# Patient Record
Sex: Male | Born: 1980 | Race: White | Hispanic: No | Marital: Single | Smoking: Current every day smoker
Health system: Southern US, Community
[De-identification: ages and names within clinical notes are randomized; demographics above are authoritative.]

## PROBLEM LIST (undated history)

## (undated) DIAGNOSIS — N289 Disorder of kidney and ureter, unspecified: Secondary | ICD-10-CM

## (undated) DIAGNOSIS — J9819 Other pulmonary collapse: Secondary | ICD-10-CM

## (undated) HISTORY — PX: APPENDECTOMY: SHX54

---

## 2014-04-22 ENCOUNTER — Encounter (HOSPITAL_COMMUNITY): Payer: Self-pay | Admitting: *Deleted

## 2014-04-22 ENCOUNTER — Inpatient Hospital Stay (HOSPITAL_COMMUNITY)
Admission: EM | Admit: 2014-04-22 | Discharge: 2014-04-26 | DRG: 201 | Disposition: A | Discharge status: Home / Self Care | Payer: Medicaid Other | Attending: General Surgery | Admitting: General Surgery

## 2014-04-22 DIAGNOSIS — Z9689 Presence of other specified functional implants: Secondary | ICD-10-CM

## 2014-04-22 DIAGNOSIS — J939 Pneumothorax, unspecified: Secondary | ICD-10-CM

## 2014-04-22 DIAGNOSIS — J40 Bronchitis, not specified as acute or chronic: Secondary | ICD-10-CM | POA: Diagnosis present

## 2014-04-22 DIAGNOSIS — Z720 Tobacco use: Secondary | ICD-10-CM

## 2014-04-22 DIAGNOSIS — R0789 Other chest pain: Secondary | ICD-10-CM | POA: Diagnosis present

## 2014-04-22 DIAGNOSIS — J069 Acute upper respiratory infection, unspecified: Secondary | ICD-10-CM | POA: Diagnosis present

## 2014-04-22 DIAGNOSIS — Z4682 Encounter for fitting and adjustment of non-vascular catheter: Secondary | ICD-10-CM

## 2014-04-22 DIAGNOSIS — F17210 Nicotine dependence, cigarettes, uncomplicated: Secondary | ICD-10-CM | POA: Diagnosis present

## 2014-04-22 DIAGNOSIS — Z809 Family history of malignant neoplasm, unspecified: Secondary | ICD-10-CM | POA: Diagnosis not present

## 2014-04-22 DIAGNOSIS — J9383 Other pneumothorax: Secondary | ICD-10-CM | POA: Diagnosis present

## 2014-04-22 LAB — CBC WITH DIFFERENTIAL/PLATELET
BASOS PCT: 0 % (ref 0–1)
Basophils Absolute: 0 10*3/uL (ref 0.0–0.1)
EOS PCT: 4 % (ref 0–5)
Eosinophils Absolute: 0.4 10*3/uL (ref 0.0–0.7)
HCT: 40.3 % (ref 39.0–52.0)
Hemoglobin: 13.9 g/dL (ref 13.0–17.0)
Lymphocytes Relative: 41 % (ref 12–46)
Lymphs Abs: 4.9 10*3/uL — ABNORMAL HIGH (ref 0.7–4.0)
MCH: 32.9 pg (ref 26.0–34.0)
MCHC: 34.5 g/dL (ref 30.0–36.0)
MCV: 95.5 fL (ref 78.0–100.0)
MONO ABS: 0.9 10*3/uL (ref 0.1–1.0)
Monocytes Relative: 8 % (ref 3–12)
NEUTROS PCT: 47 % (ref 43–77)
Neutro Abs: 5.6 10*3/uL (ref 1.7–7.7)
PLATELETS: 272 10*3/uL (ref 150–400)
RBC: 4.22 MIL/uL (ref 4.22–5.81)
RDW: 13.4 % (ref 11.5–15.5)
WBC: 11.9 10*3/uL — ABNORMAL HIGH (ref 4.0–10.5)

## 2014-04-22 NOTE — ED Provider Notes (Signed)
CSN: [id number]     Arrival date & time 04/22/14  2235 History  This chart was scribed for Andre Boozeavid Jelisa Creswell, MD by Haywood PaoNadim Abu Hashem, ED Scribe. The patient was seen in [facility] and the patient's care was started at 11:02 PM.  Chief Complaint  Patient presents with  . Shortness of Breath   Patient is a 34 y.o. male presenting with shortness of breath. The history is provided by the patient. No language interpreter was used.  Shortness of Breath Associated symptoms: chest pain, cough and diaphoresis   Associated symptoms: no fever     HPI Comments: [name] is a 34 y.o. male who presents to the Emergency Department complaining of SOB ongoing intermittently for years but the SOB has worsened 3 days ago. Pt states he goes to sleep and wakes up gasping for air. He was seen today at [facility] and had a chest x-ray and advised to come to ER. He also complains of right sided chest pain that hurts with movements "rated 20/10". Pt states he does not have pain when sitting. He has a cough producing a yellow/black phlegm, chills and sweats at night as associated symptoms. He denies fever. Pt is a currently an every day smoker.  History reviewed. No pertinent past medical history. Past Surgical History  Procedure Laterality Date  . Appendectomy     History reviewed. No pertinent family history. History  Substance Use Topics  . Smoking status: Current Every Day Smoker  . Smokeless tobacco: Not on file  . Alcohol Use: Yes    Review of Systems  Constitutional: Positive for chills and diaphoresis. Negative for fever.  Respiratory: Positive for cough and shortness of breath.   Cardiovascular: Positive for chest pain.  All other systems reviewed and are negative.   Allergies  Review of patient's allergies indicates no known allergies.  Home Medications   Prior to Admission medications   Not on File   BP 136/91 mmHg  Pulse 88  Temp(Src) 98.1 F (36.7 C) (Oral)  Resp 20  Ht 5'  10" (1.778 m)  Wt 147 lb (66.679 kg)  BMI 21.09 kg/m2  SpO2 98% Physical Exam  Constitutional: He is oriented to person, place, and time. He appears well-developed and well-nourished.  HENT:  Head: Normocephalic and atraumatic.  Eyes: EOM are normal. Pupils are equal, round, and reactive to light.  Neck: Normal range of motion. Neck supple. No JVD present. No tracheal deviation present.  Cardiovascular: Normal rate, regular rhythm and normal heart sounds.   No murmur heard. Pulmonary/Chest: Effort normal. No respiratory distress. He has no wheezes. He has no rales.  Decreased breath sounds on the right and scattered rhonchi on the right.  Abdominal: Soft. Bowel sounds are normal. He exhibits no distension and no mass. There is no tenderness.  Musculoskeletal: Normal range of motion. He exhibits no edema.  Lymphadenopathy:    He has no cervical adenopathy.  Neurological: He is alert and oriented to person, place, and time. No cranial nerve deficit. Coordination normal.  Skin: Skin is warm and dry. No rash noted.  Psychiatric: He has a normal mood and affect. His behavior is normal. Judgment and thought content normal.  Nursing note and vitals reviewed.   ED Course  Procedures  DIAGNOSTIC STUDIES: Oxygen Saturation is 98% on room air, normal by my interpretation.    COORDINATION OF CARE: 11:10 PM Discussed treatment plan with pt at bedside and pt agreed to plan.  Labs Review Results for orders placed  or performed during the hospital encounter of 04/22/14  Basic metabolic panel  Result Value Ref Range   Sodium 134 (L) 135 - 145 mmol/L   Potassium 3.4 (L) 3.5 - 5.1 mmol/L   Chloride 111 96 - 112 mmol/L   CO2 23 19 - 32 mmol/L   Glucose, Bld 105 (H) 70 - 99 mg/dL   BUN 21 6 - 23 mg/dL   Creatinine, Ser 1.61 0.50 - 1.35 mg/dL   Calcium 9.0 8.4 - 09.6 mg/dL   GFR calc non Af Amer >90 >90 mL/min   GFR calc Af Amer >90 >90 mL/min   Anion gap 0 (L) 5 - 15  CBC with Differential   Result Value Ref Range   WBC 11.9 (H) 4.0 - 10.5 K/uL   RBC 4.22 4.22 - 5.81 MIL/uL   Hemoglobin 13.9 13.0 - 17.0 g/dL   HCT 04.5 40.9 - 81.1 %   MCV 95.5 78.0 - 100.0 fL   MCH 32.9 26.0 - 34.0 pg   MCHC 34.5 30.0 - 36.0 g/dL   RDW 91.4 78.2 - 95.6 %   Platelets 272 150 - 400 K/uL   Neutrophils Relative % 47 43 - 77 %   Neutro Abs 5.6 1.7 - 7.7 K/uL   Lymphocytes Relative 41 12 - 46 %   Lymphs Abs 4.9 (H) 0.7 - 4.0 K/uL   Monocytes Relative 8 3 - 12 %   Monocytes Absolute 0.9 0.1 - 1.0 K/uL   Eosinophils Relative 4 0 - 5 %   Eosinophils Absolute 0.4 0.0 - 0.7 K/uL   Basophils Relative 0 0 - 1 %   Basophils Absolute 0.0 0.0 - 0.1 K/uL     MDM   Final diagnoses:  Spontaneous pneumothorax    Exacerbation of chronic dyspnea of. He had an x-ray done as an outpatient. I was able to retrieve that x-ray and it shows a right-sided pneumothorax of approximately 25% without any evidence of tension pneumothorax. Symptoms of been present for 3 days, so I feel he would be at relatively low risk for progression. He is placed on oxygen via nonrebreather mask. Case is discussed with Dr. Lovell Sheehan of general surgery service who states that he will be willing to follow the patient as a Research scientist (medical). Case is discussed with Dr. Selena Batten of [facility] who agrees to admit the patient.   I personally performed the services described in this documentation, which was scribed in my presence. The recorded information has been reviewed and is accurate.       Andre Booze, MD 04/23/14 [phone]

## 2014-04-22 NOTE — ED Notes (Addendum)
Sob for "years".  Worse since Friday. Cough green sputum.  Went to [facility] today and advised to come to ER  Says he had abn CXR  And needed ct of chest

## 2014-04-22 NOTE — H&P (Signed)
[name] is an 34 y.o. male.    Pcp: unassigned  Chief Complaint: cough HPI: 34 yo male with cough x 5 days,  Slight yellow sputum.  + sob, + cp with cough.   Denies fever chills, palp, n/v, diarrhea, brbpr, black stool.  Pt went to [facility] and was found to have abnormal CXR and sent to ER.  In ED pt noted to have ? Pneumothorax.  Surgery consulted by ED and requested medical admission and observation for now.  We appreciate their input.   History reviewed. No pertinent past medical history.  Past Surgical History  Procedure Laterality Date  . Appendectomy      Family History  Problem Relation Age of Onset  . Cancer Maternal Aunt    Social History:  reports that he has been smoking Cigarettes.  He has a 14 pack-year smoking history. He does not have any smokeless tobacco history on file. He reports that he drinks about 1.2 oz of alcohol per week. He reports that he does not use illicit drugs.  Allergies: No Known Allergies Medications none  (Not in a hospital admission)  No results found for this or any previous visit (from the past 48 hour(s)). No results found.  Review of Systems  Constitutional: Negative for fever, chills, weight loss, malaise/fatigue and diaphoresis.  HENT: Negative.   Eyes: Negative.   Respiratory: Positive for cough, sputum production and shortness of breath. Negative for hemoptysis and wheezing.   Cardiovascular: Positive for chest pain. Negative for palpitations, orthopnea, claudication, leg swelling and PND.  Gastrointestinal: Negative for heartburn, nausea, vomiting, abdominal pain, diarrhea, constipation, blood in stool and melena.  Genitourinary: Negative for dysuria, urgency, frequency, hematuria and flank pain.  Musculoskeletal: Negative for myalgias, back pain, joint pain, falls and neck pain.  Skin: Negative for itching and rash.  Neurological: Negative for dizziness, tingling, tremors, sensory change, speech  change, focal weakness, seizures, loss of consciousness and weakness.  Endo/Heme/Allergies: Negative for environmental allergies and polydipsia. Does not bruise/bleed easily.  Psychiatric/Behavioral: Negative for depression, suicidal ideas, hallucinations, memory loss and substance abuse. The patient is not nervous/anxious and does not have insomnia.     Blood pressure 136/91, pulse 88, temperature 98.1 F (36.7 C), temperature source Oral, resp. rate 20, height  (1.778 m), weight 66.679 kg (147 lb), SpO2 98 %. Physical Exam  Constitutional: He is oriented to person, place, and time. He appears well-developed and well-nourished.  HENT:  Head: Normocephalic and atraumatic.  Eyes: Conjunctivae and EOM are normal. Pupils are equal, round, and reactive to light. No scleral icterus.  Neck: Normal range of motion. Neck supple. No JVD present. No tracheal deviation present. No thyromegaly present.  Cardiovascular: Normal rate and regular rhythm.  Exam reveals no gallop and no friction rub.   No murmur heard. Respiratory: Effort normal and breath sounds normal. No respiratory distress. He has no wheezes. He has no rales.  GI: Soft. Bowel sounds are normal. He exhibits no distension. There is no tenderness. There is no rebound and no guarding.  Musculoskeletal: Normal range of motion. He exhibits no edema or tenderness.  Lymphadenopathy:    He has no cervical adenopathy.  Neurological: He is alert and oriented to person, place, and time. He has normal reflexes. He displays normal reflexes. No cranial nerve deficit. He exhibits normal muscle tone. Coordination normal.  Skin: Skin is warm and dry. No rash noted. No erythema. No pallor.  Psychiatric: He has a normal mood and  affect. His behavior is normal. Judgment and thought content normal.     Assessment/Plan Cough Bronchitis zithromax 500mg  iv qday tussionex  Pneumonthorax Admit to stepdown o2 Observe CXR tomorrow    Pearson GrippeKIM,  Caroleen Stoermer 04/22/2014, 11:40 PM

## 2014-04-23 ENCOUNTER — Inpatient Hospital Stay (HOSPITAL_COMMUNITY): Payer: Medicaid Other

## 2014-04-23 DIAGNOSIS — Z720 Tobacco use: Secondary | ICD-10-CM

## 2014-04-23 DIAGNOSIS — J9383 Other pneumothorax: Principal | ICD-10-CM

## 2014-04-23 LAB — CBC
HEMATOCRIT: 41 % (ref 39.0–52.0)
HEMOGLOBIN: 14 g/dL (ref 13.0–17.0)
MCH: 32.9 pg (ref 26.0–34.0)
MCHC: 34.1 g/dL (ref 30.0–36.0)
MCV: 96.5 fL (ref 78.0–100.0)
Platelets: 278 10*3/uL (ref 150–400)
RBC: 4.25 MIL/uL (ref 4.22–5.81)
RDW: 13.4 % (ref 11.5–15.5)
WBC: 11.8 10*3/uL — ABNORMAL HIGH (ref 4.0–10.5)

## 2014-04-23 LAB — BASIC METABOLIC PANEL
Anion gap: 0 — ABNORMAL LOW (ref 5–15)
BUN: 21 mg/dL (ref 6–23)
CALCIUM: 9 mg/dL (ref 8.4–10.5)
CO2: 23 mmol/L (ref 19–32)
Chloride: 111 mmol/L (ref 96–112)
Creatinine, Ser: 0.82 mg/dL (ref 0.50–1.35)
GFR calc Af Amer: >90 mL/min (ref >90)
GFR calc non Af Amer: >90 mL/min (ref >90)
GLUCOSE: 105 mg/dL — AB (ref 70–99)
Potassium: 3.4 mmol/L — ABNORMAL LOW (ref 3.5–5.1)
Sodium: 134 mmol/L — ABNORMAL LOW (ref 135–145)

## 2014-04-23 LAB — COMPREHENSIVE METABOLIC PANEL
ALBUMIN: 3.8 g/dL (ref 3.5–5.2)
ALT: 22 U/L (ref 0–53)
AST: 24 U/L (ref 0–37)
Alkaline Phosphatase: 66 U/L (ref 39–117)
Anion gap: 6 (ref 5–15)
BUN: 19 mg/dL (ref 6–23)
CALCIUM: 9 mg/dL (ref 8.4–10.5)
CO2: 23 mmol/L (ref 19–32)
Chloride: 109 mmol/L (ref 96–112)
Creatinine, Ser: 0.79 mg/dL (ref 0.50–1.35)
GFR calc Af Amer: >90 mL/min (ref >90)
GFR calc non Af Amer: >90 mL/min (ref >90)
Glucose, Bld: 91 mg/dL (ref 70–99)
Potassium: 3.8 mmol/L (ref 3.5–5.1)
SODIUM: 138 mmol/L (ref 135–145)
TOTAL PROTEIN: 7.1 g/dL (ref 6.0–8.3)
Total Bilirubin: 0.3 mg/dL (ref 0.3–1.2)

## 2014-04-23 LAB — MRSA PCR SCREENING: MRSA by PCR: POSITIVE — AB

## 2014-04-23 MED ORDER — DEXTROSE 5 % IV SOLN
INTRAVENOUS | Status: AC
  Filled 2014-04-23: qty 500

## 2014-04-23 MED ORDER — MIDAZOLAM HCL 2 MG/2ML IJ SOLN
2.0000 mg | INTRAMUSCULAR | Status: AC
  Administered 2014-04-23: 2 mg via INTRAVENOUS

## 2014-04-23 MED ORDER — ACETAMINOPHEN 325 MG PO TABS: 650.0000 mg | ORAL_TABLET | Freq: Four times a day (QID) | ORAL | Status: DC | PRN

## 2014-04-23 MED ORDER — IBUPROFEN 800 MG PO TABS
400.0000 mg | ORAL_TABLET | Freq: Three times a day (TID) | ORAL | Status: DC | PRN
  Administered 2014-04-23 – 2014-04-24 (×3): 400 mg via ORAL
  Filled 2014-04-23 (×3): qty 1

## 2014-04-23 MED ORDER — INFLUENZA VAC SPLIT QUAD 0.5 ML IM SUSY
0.5000 mL | PREFILLED_SYRINGE | INTRAMUSCULAR | Status: AC
  Administered 2014-04-24: 0.5 mL via INTRAMUSCULAR
  Filled 2014-04-23: qty 0.5

## 2014-04-23 MED ORDER — ACETAMINOPHEN 650 MG RE SUPP: 650.0000 mg | Freq: Four times a day (QID) | RECTAL | Status: DC | PRN

## 2014-04-23 MED ORDER — LORAZEPAM 2 MG/ML IJ SOLN
1.0000 mg | INTRAMUSCULAR | Status: DC | PRN
  Administered 2014-04-23 – 2014-04-24 (×2): 1 mg via INTRAVENOUS
  Filled 2014-04-23 (×2): qty 1

## 2014-04-23 MED ORDER — MUPIROCIN 2 % EX OINT
1.0000 "application " | TOPICAL_OINTMENT | Freq: Two times a day (BID) | CUTANEOUS | Status: DC
  Administered 2014-04-23 – 2014-04-26 (×7): 1 via NASAL
  Filled 2014-04-23 (×2): qty 22

## 2014-04-23 MED ORDER — PNEUMOCOCCAL VAC POLYVALENT 25 MCG/0.5ML IJ INJ
0.5000 mL | INJECTION | INTRAMUSCULAR | Status: AC
  Administered 2014-04-24: 0.5 mL via INTRAMUSCULAR
  Filled 2014-04-23 (×2): qty 0.5

## 2014-04-23 MED ORDER — MORPHINE SULFATE 2 MG/ML IJ SOLN
INTRAMUSCULAR | Status: AC
  Administered 2014-04-23: 2 mg via INTRAVENOUS
  Filled 2014-04-23: qty 1

## 2014-04-23 MED ORDER — DEXTROSE 5 % IV SOLN
500.0000 mg | Freq: Every day | INTRAVENOUS | Status: DC
  Administered 2014-04-23 – 2014-04-24 (×3): 500 mg via INTRAVENOUS
  Filled 2014-04-23 (×4): qty 500

## 2014-04-23 MED ORDER — MORPHINE SULFATE 2 MG/ML IJ SOLN
2.0000 mg | INTRAMUSCULAR | Status: AC
  Administered 2014-04-23: 2 mg via INTRAVENOUS

## 2014-04-23 MED ORDER — DEXTROSE 5 % IV SOLN: 500.0000 mg | INTRAVENOUS | Status: DC

## 2014-04-23 MED ORDER — HYDROCOD POLST-CHLORPHEN POLST 10-8 MG/5ML PO LQCR: 5.0000 mL | Freq: Two times a day (BID) | ORAL | Status: DC | PRN

## 2014-04-23 MED ORDER — CHLORHEXIDINE GLUCONATE CLOTH 2 % EX PADS
6.0000 | MEDICATED_PAD | Freq: Every day | CUTANEOUS | Status: DC
Start: 2014-04-23 — End: 2014-04-26
  Administered 2014-04-24 – 2014-04-26 (×3): 6 via TOPICAL

## 2014-04-23 MED ORDER — SODIUM CHLORIDE 0.9 % IJ SOLN
3.0000 mL | Freq: Two times a day (BID) | INTRAMUSCULAR | Status: DC
  Administered 2014-04-23 – 2014-04-26 (×7): 3 mL via INTRAVENOUS

## 2014-04-23 MED ORDER — ENOXAPARIN SODIUM 40 MG/0.4ML ~~LOC~~ SOLN
40.0000 mg | SUBCUTANEOUS | Status: DC
  Administered 2014-04-23 – 2014-04-26 (×4): 40 mg via SUBCUTANEOUS
  Filled 2014-04-23 (×4): qty 0.4

## 2014-04-23 MED ORDER — MIDAZOLAM HCL 2 MG/2ML IJ SOLN
INTRAMUSCULAR | Status: AC
  Administered 2014-04-23: 2 mg via INTRAVENOUS
  Filled 2014-04-23: qty 2

## 2014-04-23 NOTE — Progress Notes (Signed)
[facility] PROGRESS NOTE  Andre CaveJoseph Dorsey ZOX:[id number] DOB: 1980-11-22 DOA: 04/22/2014 PCP: No primary care provider on file.  Assessment/Plan: Spontaneous Right Pneumothorax -S/p chest tube placement this am. -Appreciate surgical assistance.  Tobacco Abuse -Counseled on cessation.  Code Status: Full Code Family Communication: Patient only  Disposition Plan: Home when ready   Consultants:  Surgery, Dr. Lovell SheehanJenkins   Antibiotics:  None   Subjective: No complaints. Wants to quit smoking.  Objective: Filed Vitals:   04/23/14 1000 04/23/14 1100 04/23/14 1148 04/23/14 1217  BP: 116/71 115/64  112/75  Pulse: 79 87  88  Temp:   98.5 F (36.9 C) 98.2 F (36.8 C)  TempSrc:   Oral Oral  Resp: 18 17  18   Height:      Weight:      SpO2: 96% 98%  99%    Intake/Output Summary (Last 24 hours) at 04/23/14 1823 Last data filed at 04/23/14 1800  Gross per 24 hour  Intake    730 ml  Output      0 ml  Net    730 ml   Filed Weights   04/22/14 2241 04/23/14 0113 04/23/14 0500  Weight: 66.679 kg (147 lb) 65.4 kg (144 lb 2.9 oz) 65.4 kg (144 lb 2.9 oz)    Exam:   General:  AA ox3, NAD  Cardiovascular: RRR, no M/R/G  Respiratory: CTA B  Abdomen: S/NT/ND/+BS  Extremities: no C/C/E   Neurologic:  Non-focal  Data Reviewed: Basic Metabolic Panel:  Recent Labs Lab 04/22/14 2325 04/23/14 0420  NA 134* 138  K 3.4* 3.8  CL 111 109  CO2 23 23  GLUCOSE 105* 91  BUN 21 19  CREATININE 0.82 0.79  CALCIUM 9.0 9.0   Liver Function Tests:  Recent Labs Lab 04/23/14 0420  AST 24  ALT 22  ALKPHOS 66  BILITOT 0.3  PROT 7.1  ALBUMIN 3.8   No results for input(s): LIPASE, AMYLASE in the last 168 hours. No results for input(s): AMMONIA in the last 168 hours. CBC:  Recent Labs Lab 04/22/14 2325 04/23/14 0420  WBC 11.9* 11.8*  NEUTROABS 5.6  --   HGB 13.9 14.0  HCT 40.3 41.0  MCV 95.5 96.5  PLT 272 278   Cardiac Enzymes: No results for  input(s): CKTOTAL, CKMB, CKMBINDEX, TROPONINI in the last 168 hours. BNP (last 3 results) No results for input(s): BNP in the last 8760 hours.  ProBNP (last 3 results) No results for input(s): PROBNP in the last 8760 hours.  CBG: No results for input(s): GLUCAP in the last 168 hours.  Recent Results (from the past 240 hour(s))  MRSA PCR Screening     Status: Abnormal   Collection Time: 04/23/14  1:06 AM  Result Value Ref Range Status   MRSA by PCR POSITIVE (A) NEGATIVE Final    Comment:        The GeneXpert MRSA Assay (FDA approved for NASAL specimens only), is one component of a comprehensive MRSA colonization surveillance program. It is not intended to diagnose MRSA infection nor to guide or monitor treatment for MRSA infections. RESULT CALLED TO, READ BACK BY AND VERIFIED WITH: Jaymes GraffLEANNE SCHONEWITZ ON [id number] AT 0810 BY RESSEGGER R      Studies: Dg Chest 2 View  04/23/2014   CLINICAL DATA:  Shortness of breath, pneumothorax  EXAM: CHEST  2 VIEW  COMPARISON:  04/22/2014  FINDINGS: Cardiac shadow is within normal limits. The left lung is well aerated without focal abnormality. The  right lung again demonstrates evidence of pneumothorax. The overall appearance is stable from the prior exam. Some atelectatic changes are noted in the right base stable from the prior study. No significant mediastinal shift is noted.  IMPRESSION: Right pneumothorax stable in appearance from the prior exam.   Electronically Signed   By: Alcide Clever M.D.   On: 04/23/2014 09:06   Dg Chest Port 1 View  04/23/2014   CLINICAL DATA:  Pneumothorax, chest tube placement  EXAM: PORTABLE CHEST - 1 VIEW  COMPARISON:  Portable exam 0943 hours compared to 0246 hours.  FINDINGS: New small bore lateral RIGHT thoracostomy tube identified.  Resolution of previously seen RIGHT pneumothorax.  Persistent RIGHT basilar atelectasis.  Normal heart size, mediastinal contours and pulmonary vascularity.  Remaining lungs clear.   IMPRESSION: Resolution of previously identified RIGHT pneumothorax post thoracostomy tube placement.  Persistent RIGHT basilar atelectasis.   Electronically Signed   By: Ulyses Southward M.D.   On: 04/23/2014 09:55    Scheduled Meds: . azithromycin  500 mg Intravenous QHS  . Chlorhexidine Gluconate Cloth  6 each Topical Q0600  . enoxaparin (LOVENOX) injection  40 mg Subcutaneous Q24H  . [START ON 04/24/2014] Influenza vac split quadrivalent PF  0.5 mL Intramuscular Tomorrow-1000  . mupirocin ointment  1 application Nasal BID  . [START ON 04/24/2014] pneumococcal 23 valent vaccine  0.5 mL Intramuscular Tomorrow-1000  . sodium chloride  3 mL Intravenous Q12H   Continuous Infusions:   Principal Problem:   Spontaneous pneumothorax Active Problems:   Pneumothorax    Time spent: 25 minutes. Greater than 50% of this time was spent in direct contact with the patient coordinating care.    Chaya Jan  [facility] Pager [phone]  If 7PM-7AM, please contact night-coverage at [email or web address], password [facility] 04/23/2014, 6:23 PM  LOS: 1 day

## 2014-04-23 NOTE — Progress Notes (Signed)
CRITICAL VALUE ALERT  Critical value received:  MRSA positive Date of notification:  04/23/2014 Time of notification:  9:55 AM Critical value read back:Yes.    Nurse who received alert:  Candelaria StagersLeigh anne schonewitz, rn   MD notified (1st page):  Ardyth HarpsHernandez  Time of first page:  Will notify on rounds, standing orders in place  MD notified (2nd page):  Time of second page  Responding MD:  Ardyth HarpsHernandez  Time MD responded:  Will notify on rounds

## 2014-04-23 NOTE — Progress Notes (Signed)
When transferred patient to room 301, the [place] drain fluid was no longer tidaling in the chamber. MD notified and stated it was "fine".  Passed on MD message in report. Schonewitz, Candelaria StagersLeigh Anne 04/23/2014

## 2014-04-23 NOTE — Progress Notes (Signed)
[facility], LPN on dept [id number]. Verbalized understanding.  Pt transferred to room 301 in safe and stable condition. Schonewitz, Candelaria StagersLeigh Anne 04/23/2014

## 2014-04-23 NOTE — Procedures (Signed)
Patient:  Andre CaveJoseph Dorsey  DOB:  December 15, 1980  MRN:  [id number]   Preop Diagnosis:  Spontaneous right pneumothorax  Postop Diagnosis:  Same  Procedure:  Right chest tube placement  Surgeon:  Franky MachoMark Pinchos Topel, M.D.  Anes:  Morphine 2 mg IV, Versed 2 mg IV  Indications:  Patient is a 34 year old white male heavy smoker who presented from an outside facility with a 10% right apical pneumothorax. He was admitted to the intensive care unit for further evaluation treatment. Follow-up chest x-ray reveals worsening collapse of the right lung. The patient needs a right chest tube placed. The risks and benefits of the procedure including bleeding and infection were fully explained to the patient, who gave informed consent.  Procedure note:  The patient was placed left lateral decubitus position after Demerol and morphine were given. Surgical site confirmation was performed. 1% Xylocaine was placed along the anterior lateral axillary line on the right. This was at approximately the fifth intercostal space. A small bore chest tube was then placed without difficulty. This was hoped to the Pleur-evac and air was aspirated from the pleural space. The patient's oxygen saturations at the end of the procedure had increased from 90 to 96%. The catheter was secured in place using a 3-0 silk suture. A dressing was then applied.  The patient tolerated procedure well. A follow-up chest x-ray has been ordered.  Complications:  None  EBL:  Minimal  Specimen:  None

## 2014-04-23 NOTE — Progress Notes (Signed)
Reviewed MRSA positive handout with patient and swabbed nares with mupirocin as ordered. Patient asked appropriate questions.  Will continue to educate patient about MRSA. Schonewitz, Candelaria StagersLeigh Anne 04/23/2014

## 2014-04-23 NOTE — Progress Notes (Addendum)
Reviewed smoking cessation handouts with patient. Verbalized understanding. Encouraged patient to ask questions after further reading handouts.  Patient states he is interested in quitting smoking.  Will continue to support patient. Schonewitz, Candelaria StagersLeigh Anne 04/23/2014

## 2014-04-23 NOTE — Progress Notes (Signed)
UR chart review completed.  

## 2014-04-23 NOTE — Plan of Care (Signed)
Problem: Consults Goal: Respiratory Problems Patient Education See Patient Education Module for education specifics.  Outcome: Progressing Pt resting [name] 2 liters without any SOB,Chest pain & resting & talking to family  Problem: ICU Phase Progression Outcomes Goal: O2 sats trending toward baseline Outcome: Not Progressing Saturation=96% on 2 liters [name] Goal: Hemodynamically stable Outcome: Progressing Vital signs are stable Goal: Pain controlled with appropriate interventions Outcome: Progressing No C/O pain Goal: Flu/PneumoVaccines if indicated Outcome: Progressing Vaccines to be given in Am

## 2014-04-24 ENCOUNTER — Inpatient Hospital Stay (HOSPITAL_COMMUNITY): Payer: Medicaid Other

## 2014-04-24 MED ORDER — TEMAZEPAM 15 MG PO CAPS
30.0000 mg | ORAL_CAPSULE | Freq: Every evening | ORAL | Status: DC | PRN
  Administered 2014-04-24 – 2014-04-25 (×2): 30 mg via ORAL
  Filled 2014-04-24 (×2): qty 2

## 2014-04-24 MED ORDER — HYDROMORPHONE HCL 1 MG/ML IJ SOLN
1.0000 mg | INTRAMUSCULAR | Status: DC | PRN
  Administered 2014-04-24 – 2014-04-25 (×4): 1 mg via INTRAVENOUS
  Filled 2014-04-24 (×4): qty 1

## 2014-04-24 NOTE — Progress Notes (Signed)
[facility] PROGRESS NOTE  [name] ZOX:[id number] DOB: 10/20/1980 DOA: 04/22/2014 PCP: No primary care provider on file.  Assessment/Plan:  Spontaneous Right Pneumothorax -S/p chest tube placement , X Ray 04-24-14 shows resolution of pneumothorax, general surgery following we will defer to surgery on removal of chest tube.   Mild URI. Continue azithromycin. No pneumonia.    Tobacco Abuse -Counseled on cessation.    Code Status: Full Code Family Communication: Patient only  Disposition Plan: Home when ready  DVT Prophylaxis - lovenox  Consultants:  Surgery, Dr. Lovell Sheehan   Antibiotics:  None   Subjective: No complaints. Wants to quit smoking.  denies any chest or abdominal pain no shortness of breath.   Objective: Filed Vitals:   04/23/14 1148 04/23/14 1217 04/23/14 2300 04/24/14 0500  BP:  112/75 112/70 106/60  Pulse:  88 82 86  Temp: 98.5 F (36.9 C) 98.2 F (36.8 C) 97.6 F (36.4 C) 98 F (36.7 C)  TempSrc: Oral Oral Oral Oral  Resp:  Height:      Weight:    65.2 kg (143 lb 11.8 oz)  SpO2:  99% 100% 100%    Intake/Output Summary (Last 24 hours) at 04/24/14 0949 Last data filed at 04/23/14 1800  Gross per 24 hour  Intake    480 ml  Output      0 ml  Net    480 ml   Filed Weights   04/23/14 0113 04/23/14 0500 04/24/14 0500  Weight: 65.4 kg (144 lb 2.9 oz) 65.4 kg (144 lb 2.9 oz) 65.2 kg (143 lb 11.8 oz)    Exam:   General:  AA ox3, NAD  Cardiovascular: RRR, no M/R/G  Respiratory: CTA B, right-sided chest tube in place.   Abdomen: S/NT/ND/+BS  Extremities: no C/C/E   Neurologic:  Non-focal  Data Reviewed: Basic Metabolic Panel:  Recent Labs Lab 04/22/14 2325 04/23/14 0420  NA 134* 138  K 3.4* 3.8  CL 111 109  CO2 23 23  GLUCOSE 105* 91  BUN 21 19  CREATININE 0.82 0.79  CALCIUM 9.0 9.0   Liver Function Tests:  Recent Labs Lab 04/23/14 0420  AST 24  ALT 22  ALKPHOS 66  BILITOT 0.3  PROT  7.1  ALBUMIN 3.8   No results for input(s): LIPASE, AMYLASE in the last 168 hours. No results for input(s): AMMONIA in the last 168 hours. CBC:  Recent Labs Lab 04/22/14 2325 04/23/14 0420  WBC 11.9* 11.8*  NEUTROABS 5.6  --   HGB 13.9 14.0  HCT 40.3 41.0  MCV 95.5 96.5  PLT 272 278   Cardiac Enzymes: No results for input(s): CKTOTAL, CKMB, CKMBINDEX, TROPONINI in the last 168 hours. BNP (last 3 results) No results for input(s): BNP in the last 8760 hours.  ProBNP (last 3 results) No results for input(s): PROBNP in the last 8760 hours.  CBG: No results for input(s): GLUCAP in the last 168 hours.  Recent Results (from the past 240 hour(s))  MRSA PCR Screening     Status: Abnormal   Collection Time: 04/23/14  1:06 AM  Result Value Ref Range Status   MRSA by PCR POSITIVE (A) NEGATIVE Final    Comment:        The GeneXpert MRSA Assay (FDA approved for NASAL specimens only), is one component of a comprehensive MRSA colonization surveillance program. It is not intended to diagnose MRSA infection nor to guide or monitor treatment for MRSA infections. RESULT CALLED TO,  READ BACK BY AND VERIFIED WITHJaymes Graff: LEANNE SCHONEWITZ ON [id number] AT [id number] BY RESSEGGER R      Studies: Dg Chest 2 View  04/23/2014   CLINICAL DATA:  Shortness of breath, pneumothorax  EXAM: CHEST  2 VIEW  COMPARISON:  04/22/2014  FINDINGS: Cardiac shadow is within normal limits. The left lung is well aerated without focal abnormality. The right lung again demonstrates evidence of pneumothorax. The overall appearance is stable from the prior exam. Some atelectatic changes are noted in the right base stable from the prior study. No significant mediastinal shift is noted.  IMPRESSION: Right pneumothorax stable in appearance from the prior exam.   Electronically Signed   By: Alcide CleverMark  Lukens M.D.   On: 04/23/2014 09:06   Dg Chest Port 1 View  04/24/2014   CLINICAL DATA:  Pneumothorax.  EXAM: PORTABLE CHEST - 1 VIEW   COMPARISON:  03/23/2014.  FINDINGS: Right chest tube in stable position. No pneumothorax noted. Mediastinum and hilar structures are unremarkable. Mild right base atelectasis. No pleural effusion or pneumothorax. No acute bony abnormality.  IMPRESSION: 1. Right chest tube in stable position.  No pneumothorax. 2. Mild right base atelectasis.   Electronically Signed   By: Maisie Fushomas  Register   On: 04/24/2014 07:33   Dg Chest Port 1 View  04/23/2014   CLINICAL DATA:  Pneumothorax, chest tube placement  EXAM: PORTABLE CHEST - 1 VIEW  COMPARISON:  Portable exam 0943 hours compared to 0246 hours.  FINDINGS: New small bore lateral RIGHT thoracostomy tube identified.  Resolution of previously seen RIGHT pneumothorax.  Persistent RIGHT basilar atelectasis.  Normal heart size, mediastinal contours and pulmonary vascularity.  Remaining lungs clear.  IMPRESSION: Resolution of previously identified RIGHT pneumothorax post thoracostomy tube placement.  Persistent RIGHT basilar atelectasis.   Electronically Signed   By: Ulyses SouthwardMark  Boles M.D.   On: 04/23/2014 09:55    Scheduled Meds: . azithromycin  500 mg Intravenous QHS  . Chlorhexidine Gluconate Cloth  6 each Topical Q0600  . enoxaparin (LOVENOX) injection  40 mg Subcutaneous Q24H  . mupirocin ointment  1 application Nasal BID  . pneumococcal 23 valent vaccine  0.5 mL Intramuscular Tomorrow-1000  . sodium chloride  3 mL Intravenous Q12H   Continuous Infusions:   Principal Problem:   Spontaneous pneumothorax Active Problems:   Pneumothorax   Tobacco abuse    Time spent: 25 minutes. Greater than 50% of this time was spent in direct contact with the patient coordinating care.    Leroy SeaSINGH,Dyanara Cozza K  [facility] Pager [phone]  If 7PM-7AM, please contact night-coverage at [email or web address], password [email or web address] 04/24/2014, 9:49 AM  LOS: 2 days

## 2014-04-24 NOTE — Care Management Note (Addendum)
    Page 1 of 1   04/26/2014     10:43:15 AM CARE MANAGEMENT NOTE 04/26/2014  Patient:  [name],[name]   Account Number:  [id number]  Date Initiated:  04/24/2014  Documentation initiated by:  Kathyrn SheriffHILDRESS,JESSICA  Subjective/Objective Assessment:   Pt is from home and independent with ADL's. Pt plans to discharge home with self care. No CM needs.     Action/Plan:   Anticipated DC Date:  04/25/2014   Anticipated DC Plan:  HOME/SELF CARE      DC Planning Services  CM consult      Choice offered to / List presented to:             Status of service:  Completed, signed off Medicare Important Message given?   (If response is "NO", the following Medicare IM given date fields will be blank) Date Medicare IM given:   Medicare IM given by:   Date Additional Medicare IM given:   Additional Medicare IM given by:    Discharge Disposition:  HOME/SELF CARE  Per UR Regulation:  Reviewed for med. necessity/level of care/duration of stay  If discussed at Long Length of Stay Meetings, dates discussed:    Comments:  04/26/2014 1030 Kathyrn SheriffJessica Childress, RN, MSN, CM Pt discharged home today. No CM needs. 04/24/2014 1000 Kathyrn SheriffJessica Childress, RN, MSN, CM

## 2014-04-24 NOTE — Clinical Documentation Improvement (Signed)
"  Bronchitis, treated with Zithromax 500 mg IV every day" documented in H&P only.  If the diagnosis of Bronchitis is applicable to this admission, please document the ACUITY of the bronchitis:   - Acute   - Chronic   - Other Type   - Unable to Clinically Determine   - The diagnosis of Bronchitis is not applicable to this admission   Thank You, Jerral Ralphathy R Roby Spalla ,RN Clinical Documentation Specialist:  [phone] [facility]- Health Information Management

## 2014-04-24 NOTE — Progress Notes (Signed)
Patient complains of sudden sharp pain in side where chest tube is inserted.  Happened upon sitting up.  Pain has not been an issue thus far.  Tubing and dressing checked - appears to be working as it should.  Patient with no complaints of SOB and O2 sats 98% on RA.  Lungs clear.  MD notified, orders given for dilaudid for pain.  Thought to maybe be related to muscle spasm.

## 2014-04-24 NOTE — Progress Notes (Signed)
Subjective: No significant chest pain. No shortness of breath noted.  Objective: Vital signs in last 24 hours: Temp:  [97.6 F (36.4 C)-98.5 F (36.9 C)] 98 F (36.7 C) (03/02 0500) Pulse Rate:  [82-88] 86 (03/02 0500) Resp:  [17-20] 20 (03/02 0500) BP: (106-115)/(60-75) 106/60 mmHg (03/02 0500) SpO2:  [98 %-100 %] 100 % (03/02 0500) Weight:  [65.2 kg (143 lb 11.8 oz)] 65.2 kg (143 lb 11.8 oz) (03/02 0500) Last BM Date: 04/23/14  Intake/Output from previous day: 03/01 0701 - 03/02 0700 In: 480 [P.O.:480] Out: -  Intake/Output this shift:    General appearance: alert, cooperative and no distress Resp: clear to auscultation bilaterally and Chest tube in place without air leak.  Lab Results:   Recent Labs  04/22/14 2325 04/23/14 0420  WBC 11.9* 11.8*  HGB 13.9 14.0  HCT 40.3 41.0  PLT 272 278   BMET  Recent Labs  04/22/14 2325 04/23/14 0420  NA 134* 138  K 3.4* 3.8  CL 111 109  CO2 23 23  GLUCOSE 105* 91  BUN 21 19  CREATININE 0.82 0.79  CALCIUM 9.0 9.0   PT/INR No results for input(s): LABPROT, INR in the last 72 hours.  Studies/Results: Dg Chest 2 View  04/23/2014   CLINICAL DATA:  Shortness of breath, pneumothorax  EXAM: CHEST  2 VIEW  COMPARISON:  04/22/2014  FINDINGS: Cardiac shadow is within normal limits. The left lung is well aerated without focal abnormality. The right lung again demonstrates evidence of pneumothorax. The overall appearance is stable from the prior exam. Some atelectatic changes are noted in the right base stable from the prior study. No significant mediastinal shift is noted.  IMPRESSION: Right pneumothorax stable in appearance from the prior exam.   Electronically Signed   By: Alcide CleverMark  Lukens M.D.   On: 04/23/2014 09:06   Dg Chest Port 1 View  04/24/2014   CLINICAL DATA:  Pneumothorax.  EXAM: PORTABLE CHEST - 1 VIEW  COMPARISON:  03/23/2014.  FINDINGS: Right chest tube in stable position. No pneumothorax noted. Mediastinum and hilar  structures are unremarkable. Mild right base atelectasis. No pleural effusion or pneumothorax. No acute bony abnormality.  IMPRESSION: 1. Right chest tube in stable position.  No pneumothorax. 2. Mild right base atelectasis.   Electronically Signed   By: Maisie Fushomas  Register   On: 04/24/2014 07:33   Dg Chest Port 1 View  04/23/2014   CLINICAL DATA:  Pneumothorax, chest tube placement  EXAM: PORTABLE CHEST - 1 VIEW  COMPARISON:  Portable exam 0943 hours compared to 0246 hours.  FINDINGS: New small bore lateral RIGHT thoracostomy tube identified.  Resolution of previously seen RIGHT pneumothorax.  Persistent RIGHT basilar atelectasis.  Normal heart size, mediastinal contours and pulmonary vascularity.  Remaining lungs clear.  IMPRESSION: Resolution of previously identified RIGHT pneumothorax post thoracostomy tube placement.  Persistent RIGHT basilar atelectasis.   Electronically Signed   By: Ulyses SouthwardMark  Boles M.D.   On: 04/23/2014 09:55    Anti-infectives: Anti-infectives    Start     Dose/Rate Route Frequency Ordered Stop   04/23/14 0115  azithromycin (ZITHROMAX) 500 mg in dextrose 5 % 250 mL IVPB     500 mg 250 mL/hr over 60 Minutes Intravenous Daily at bedtime 04/23/14 0113     04/23/14 0100  azithromycin (ZITHROMAX) 500 mg in dextrose 5 % 250 mL IVPB  Status:  Discontinued     500 mg 250 mL/hr over 60 Minutes Intravenous Every 24 hours 04/23/14 0055 04/23/14  0112   04/23/14 0058  dextrose 5 % with azithromycin [facility]) ADS Med  Status:  Discontinued    Comments:  Venetia Night   : cabinet override      04/23/14 0058 04/23/14 0112      Assessment/Plan: Impression: Spontaneous right pneumothorax, resolved. Chest tube in place. Plan: Continue current management. Will check chest x-ray in a.m. If it is okay, will convert to waterseal.  LOS: 2 days    [place] Luppino A 04/24/2014

## 2014-04-25 ENCOUNTER — Inpatient Hospital Stay (HOSPITAL_COMMUNITY): Payer: Medicaid Other

## 2014-04-25 MED ORDER — OXYCODONE-ACETAMINOPHEN 5-325 MG PO TABS
1.0000 | ORAL_TABLET | ORAL | Status: DC | PRN
  Administered 2014-04-25 (×2): 1 via ORAL
  Administered 2014-04-26: 2 via ORAL
  Filled 2014-04-25: qty 2
  Filled 2014-04-25 (×2): qty 1

## 2014-04-25 MED ORDER — AZITHROMYCIN 250 MG PO TABS
500.0000 mg | ORAL_TABLET | Freq: Every day | ORAL | Status: DC
  Administered 2014-04-25: 500 mg via ORAL
  Filled 2014-04-25: qty 2

## 2014-04-25 NOTE — Progress Notes (Signed)
Subjective: Right sided chest pain resolved. No shortness of breath.  Objective: Vital signs in last 24 hours: Temp:  [97.8 F (36.6 C)-98.6 F (37 C)] 98.6 F (37 C) (03/03 0500) Pulse Rate:  [70-100] 70 (03/03 0500) Resp:  [15-20] 15 (03/03 0500) BP: (113-123)/(69-75) 113/75 mmHg (03/03 0500) SpO2:  [98 %] 98 % (03/03 0500) Last BM Date: 04/24/14  Intake/Output from previous day: 03/02 0701 - 03/03 0700 In: 720 [P.O.:720] Out: 550 [Urine:550] Intake/Output this shift:    General appearance: alert, cooperative and no distress Resp: clear to auscultation bilaterally and No air leak in pleura vac Cardio: regular rate and rhythm, S1, S2 normal, no murmur, click, rub or gallop  Lab Results:   Recent Labs  04/22/14 2325 04/23/14 0420  WBC 11.9* 11.8*  HGB 13.9 14.0  HCT 40.3 41.0  PLT 272 278   BMET  Recent Labs  04/22/14 2325 04/23/14 0420  NA 134* 138  K 3.4* 3.8  CL 111 109  CO2 23 23  GLUCOSE 105* 91  BUN 21 19  CREATININE 0.82 0.79  CALCIUM 9.0 9.0   PT/INR No results for input(s): LABPROT, INR in the last 72 hours.  Studies/Results: Dg Chest Port 1 View  04/25/2014   CLINICAL DATA:  Right-sided chest tube.  EXAM: PORTABLE CHEST - 1 VIEW  COMPARISON:  04/24/2014.  FINDINGS: Support apparatus: Small bowl or RIGHT thoracostomy tube is present at the RIGHT lateral base. No change in position.  Cardiomediastinal Silhouette:  Unchanged.  Lungs: Increased density is present along the RIGHT heart border, most likely representing atelectasis in this patient with a RIGHT thoracostomy tube. This appears slightly more prominent than on prior. No pneumothorax.  Effusions:  None.  Other:  None.  IMPRESSION: Unchanged small bore RIGHT thoracostomy tube.  No pneumothorax.   Electronically Signed   By: Andreas Newport M.D.   On: 04/25/2014 07:28   Dg Chest Port 1 View  04/24/2014   CLINICAL DATA:  Pneumothorax.  EXAM: PORTABLE CHEST - 1 VIEW  COMPARISON:  03/23/2014.   FINDINGS: Right chest tube in stable position. No pneumothorax noted. Mediastinum and hilar structures are unremarkable. Mild right base atelectasis. No pleural effusion or pneumothorax. No acute bony abnormality.  IMPRESSION: 1. Right chest tube in stable position.  No pneumothorax. 2. Mild right base atelectasis.   Electronically Signed   By: Maisie Fus  Register   On: 04/24/2014 07:33   Dg Chest Port 1 View  04/23/2014   CLINICAL DATA:  Pneumothorax, chest tube placement  EXAM: PORTABLE CHEST - 1 VIEW  COMPARISON:  Portable exam 0943 hours compared to 0246 hours.  FINDINGS: New small bore lateral RIGHT thoracostomy tube identified.  Resolution of previously seen RIGHT pneumothorax.  Persistent RIGHT basilar atelectasis.  Normal heart size, mediastinal contours and pulmonary vascularity.  Remaining lungs clear.  IMPRESSION: Resolution of previously identified RIGHT pneumothorax post thoracostomy tube placement.  Persistent RIGHT basilar atelectasis.   Electronically Signed   By: Ulyses Southward M.D.   On: 04/23/2014 09:55    Anti-infectives: Anti-infectives    Start     Dose/Rate Route Frequency Ordered Stop   04/23/14 0115  azithromycin (ZITHROMAX) 500 mg in dextrose 5 % 250 mL IVPB     500 mg 250 mL/hr over 60 Minutes Intravenous Daily at bedtime 04/23/14 0113     04/23/14 0100  azithromycin (ZITHROMAX) 500 mg in dextrose 5 % 250 mL IVPB  Status:  Discontinued     500 mg 250 mL/hr  over 60 Minutes Intravenous Every 24 hours 04/23/14 0055 04/23/14 0112   04/23/14 0058  dextrose 5 % with azithromycin [facility](ZITHROMAX) ADS Med  Status:  Discontinued    Comments:  Venetia NightMoore, Sharon   : cabinet override      04/23/14 0058 04/23/14 0112      Assessment/Plan: Impression: Right pneumothorax, resolved. Plan: We'll place chest tube to waterseal. Check x-ray in a.m. Hopefully will discharge in next 24-48 hours.  LOS: 3 days    [name] A 04/25/2014

## 2014-04-25 NOTE — Progress Notes (Signed)
PHARMACIST - PHYSICIAN COMMUNICATION DR:   Lovell SheehanJenkins CONCERNING: Antibiotic IV to Oral Route Change Policy  RECOMMENDATION: This patient is receiving Zithromax by the intravenous route.  Based on criteria approved by the Pharmacy and Therapeutics Committee, the antibiotic(s) is/are being converted to the equivalent oral dose form(s).  DESCRIPTION: These criteria include:  Patient being treated for a respiratory tract infection, urinary tract infection, cellulitis or clostridium difficile associated diarrhea if on metronidazole  The patient is not neutropenic and does not exhibit a GI malabsorption state  The patient is eating (either orally or via tube) and/or has been taking other orally administered medications for a least 24 hours  The patient is improving clinically and has a Tmax < 100.5  If you have questions about this conversion, please contact the Pharmacy Department  [x]   [phone]( [phone] )  [facility] []   [phone]( [phone] )  [facility]  []   [phone]( [phone] )  [facility] []   [phone]( [phone] )  [facility]    Valrie HartScott Jerrod Damiano, PharmD

## 2014-04-26 ENCOUNTER — Inpatient Hospital Stay (HOSPITAL_COMMUNITY): Payer: Medicaid Other

## 2014-04-26 MED ORDER — OXYCODONE-ACETAMINOPHEN 5-325 MG PO TABS: 1.0000 | ORAL_TABLET | ORAL | Status: DC | PRN

## 2014-04-26 NOTE — Discharge Instructions (Signed)
Pneumothorax °A pneumothorax, commonly called a collapsed lung, is a condition in which air leaks from a lung and builds up in the space between the lung and the chest wall (pleural space). The air in a pneumothorax is trapped outside the lung and takes up space, preventing the lung from fully expanding. This is a condition that usually occurs suddenly. The buildup of air may be small or large. A small pneumothorax may go away on its own. When a pneumothorax is larger, it will often require medical treatment and hospitalization.  °CAUSES  °A pneumothorax can sometimes happen quickly with no apparent cause. People with underlying lung problems, particularly COPD or emphysema, are at higher risk of pneumothorax. However, pneumothorax can happen quickly even in people with no prior known lung problems. Trauma, surgery, medical procedures, or injury to the chest wall can also cause a pneumothorax. °SIGNS AND SYMPTOMS  °Sometimes a pneumothorax will have no symptoms. When symptoms are present, they can include: °· Chest pain. °· Shortness of breath. °· Increased rate of breathing. °· Bluish color to your lips or skin (cyanosis). °DIAGNOSIS  °Pneumothorax is usually diagnosed by a chest X-ray or chest CT scan. Your health care provider will also take a medical history and perform a physical exam to determine why you may have a pneumothorax. °TREATMENT  °A small pneumothorax may go away on its own without treatment. Extra oxygen can sometimes help a small pneumothorax go away more quickly. For a larger pneumothorax or a pneumothorax that is causing symptoms, a procedure is usually needed to drain the air. In some cases, the health care provider may drain the air using a needle. In other cases, a chest tube may be inserted into the pleural space. A chest tube is a small tube placed between the ribs and into the pleural space. This removes the extra air and allows the lung to expand back to its normal size. A large  pneumothorax will usually require a hospital stay. If there is ongoing air leakage into the pleural space, then the chest tube may need to remain in place for several days until the air leak has healed. In some cases, surgery may be needed.  °HOME CARE INSTRUCTIONS  °· Only take over-the-counter or prescription medicines as directed by your health care provider. °· If a cough or pain makes it difficult for you to sleep at night, try sleeping in a semi-upright position in a recliner or by using 2 or 3 pillows. °· Rest and limit activity as directed by your health care provider. °· If you had a chest tube and it was removed, ask your health care provider when it is okay to remove the dressing. Until your health care provider says you can remove the dressing, do not allow it to get wet. °· Do not smoke. Smoking is a risk factor for pneumothorax. °· Do not fly in an airplane or scuba dive until your health care provider says it is okay. °· Follow up with your health care provider as directed. °SEEK IMMEDIATE MEDICAL CARE IF:  °· You have increasing chest pain or shortness of breath. °· You have a cough that is not controlled with suppressants. °· You begin coughing up blood. °· You have pain that is getting worse or is not controlled with medicines. °· You cough up thick, discolored mucus (sputum) that is yellow to green in color. °· You have redness, increasing pain, or discharge at the site where a chest tube had been in place (if   your pneumothorax was treated with a chest tube). °· The site where your chest tube was located opens up. °· You feel air coming out of the site where the chest tube was placed. °· You have a fever or persistent symptoms for more than 2-3 days. °· You have a fever and your symptoms suddenly get worse. °MAKE SURE YOU:  °· Understand these instructions. °· Will watch your condition. °· Will get help right away if you are not doing well or get worse. °Document Released: 02/08/2005 Document  Revised: 11/29/2012 Document Reviewed: 09/07/2012 °ExitCare® Patient Information ©2015 [employer]. This information is not intended to replace advice given to you by your health care provider. Make sure you discuss any questions you have with your health care provider. °Smoking Cessation °Quitting smoking is important to your health and has many advantages. However, it is not always easy to quit since nicotine is a very addictive drug. Oftentimes, people try 3 times or more before being able to quit. This document explains the best ways for you to prepare to quit smoking. Quitting takes hard work and a lot of effort, but you can do it. °ADVANTAGES OF QUITTING SMOKING °· You will live longer, feel better, and live better. °· Your body will feel the impact of quitting smoking almost immediately. °¨ Within 20 minutes, blood pressure decreases. Your pulse returns to its normal level. °¨ After 8 hours, carbon monoxide levels in the blood return to normal. Your oxygen level increases. °¨ After 24 hours, the chance of having a heart attack starts to decrease. Your breath, hair, and body stop smelling like smoke. °¨ After 48 hours, damaged nerve endings begin to recover. Your sense of taste and smell improve. °¨ After 72 hours, the body is virtually free of nicotine. Your bronchial tubes relax and breathing becomes easier. °¨ After 2 to 12 weeks, lungs can hold more air. Exercise becomes easier and circulation improves. °· The risk of having a heart attack, stroke, cancer, or lung disease is greatly reduced. °¨ After 1 year, the risk of coronary heart disease is cut in half. °¨ After 5 years, the risk of stroke falls to the same as a nonsmoker. °¨ After 10 years, the risk of lung cancer is cut in half and the risk of other cancers decreases significantly. °¨ After 15 years, the risk of coronary heart disease drops, usually to the level of a nonsmoker. °· If you are pregnant, quitting smoking will improve your chances of  having a healthy baby. °· The people you live with, especially any children, will be healthier. °· You will have extra money to spend on things other than cigarettes. °QUESTIONS TO THINK ABOUT BEFORE ATTEMPTING TO QUIT °You may want to talk about your answers with your health care provider. °· Why do you want to quit? °· If you tried to quit in the past, what helped and what did not? °· What will be the most difficult situations for you after you quit? How will you plan to handle them? °· Who can help you through the tough times? Your family? Friends? A health care provider? °· What pleasures do you get from smoking? What ways can you still get pleasure if you quit? °Here are some questions to ask your health care provider: °· How can you help me to be successful at quitting? °· What medicine do you think would be best for me and how should I take it? °· What should I do if I need   more help? °· What is smoking withdrawal like? How can I get information on withdrawal? °GET READY °· Set a quit date. °· Change your environment by getting rid of all cigarettes, ashtrays, matches, and lighters in your home, car, or work. Do not let people smoke in your home. °· Review your past attempts to quit. Think about what worked and what did not. °GET SUPPORT AND ENCOURAGEMENT °You have a better chance of being successful if you have help. You can get support in many ways. °· Tell your family, friends, and coworkers that you are going to quit and need their support. Ask them not to smoke around you. °· Get individual, group, or telephone counseling and support. Programs are available at local hospitals and health centers. Call your local health department for information about programs in your area. °· Spiritual beliefs and practices may help some smokers quit. °· Download a "quit meter" on your computer to keep track of quit statistics, such as how long you have gone without smoking, cigarettes not smoked, and money saved. °· Get  a self-help book about quitting smoking and staying off tobacco. °LEARN NEW SKILLS AND BEHAVIORS °· Distract yourself from urges to smoke. Talk to someone, go for a walk, or occupy your time with a task. °· Change your normal routine. Take a different route to work. Drink tea instead of coffee. Eat breakfast in a different place. °· Reduce your stress. Take a hot bath, exercise, or read a book. °· Plan something enjoyable to do every day. Reward yourself for not smoking. °· Explore interactive web-based programs that specialize in helping you quit. °GET MEDICINE AND USE IT CORRECTLY °Medicines can help you stop smoking and decrease the urge to smoke. Combining medicine with the above behavioral methods and support can greatly increase your chances of successfully quitting smoking. °· Nicotine replacement therapy helps deliver nicotine to your body without the negative effects and risks of smoking. Nicotine replacement therapy includes nicotine gum, lozenges, inhalers, nasal sprays, and skin patches. Some may be available over-the-counter and others require a prescription. °· Antidepressant medicine helps people abstain from smoking, but how this works is unknown. This medicine is available by prescription. °· Nicotinic receptor partial agonist medicine simulates the effect of nicotine in your brain. This medicine is available by prescription. °Ask your health care provider for advice about which medicines to use and how to use them based on your health history. Your health care provider will tell you what side effects to look out for if you choose to be on a medicine or therapy. Carefully read the information on the package. Do not use any other product containing nicotine while using a nicotine replacement product.  °RELAPSE OR DIFFICULT SITUATIONS °Most relapses occur within the first 3 months after quitting. Do not be discouraged if you start smoking again. Remember, most people try several times before finally  quitting. You may have symptoms of withdrawal because your body is used to nicotine. You may crave cigarettes, be irritable, feel very hungry, cough often, get headaches, or have difficulty concentrating. The withdrawal symptoms are only temporary. They are strongest when you first quit, but they will go away within 10-14 days. °To reduce the chances of relapse, try to: °· Avoid drinking alcohol. Drinking lowers your chances of successfully quitting. °· Reduce the amount of caffeine you consume. Once you quit smoking, the amount of caffeine in your body increases and can give you symptoms, such as a rapid heartbeat, sweating, and anxiety. °·   Avoid smokers because they can make you want to smoke. °· Do not let weight gain distract you. Many smokers will gain weight when they quit, usually less than 10 pounds. Eat a healthy diet and stay active. You can always lose the weight gained after you quit. °· Find ways to improve your mood other than smoking. °FOR MORE INFORMATION  °www.smokefree.gov  °Document Released: 02/02/2001 Document Revised: 06/25/2013 Document Reviewed: 05/20/2011 °ExitCare® Patient Information ©2015 [employer]. This information is not intended to replace advice given to you by your health care provider. Make sure you discuss any questions you have with your health care provider. ° °

## 2014-04-26 NOTE — Discharge Summary (Signed)
Physician Discharge Summary  Patient ID: Andre CaveJoseph Schnyder MRN: [id number] DOB/AGE: 05-27-1980 33 y.o.  Admit date: 04/22/2014 Discharge date: 04/26/2014  Admission Diagnoses:  Right pneumothorax, spontaneous  Discharge Diagnoses: Same Principal Problem:   Spontaneous pneumothorax Active Problems:   Pneumothorax   Tobacco abuse   Discharged Condition: good  Hospital Course: Patient is a 34 year old white male who was referred to the emergency room on 04/22/2014 with worsening right-sided chest pain and a spontaneous right pneumothorax. This was found on an outside film.  He was admitted to hospital for observation. The following morning, his pneumothorax persisted and his oxygen saturation saturations were in the low 90s. It was elected to proceed with a right chest tube placement. He tolerated the procedure well. He was left on suction for several days. He was placed on waterseal on 04/25/2014. On 04/26/2014, the chest tube was removed and a follow-up x-ray showed resolution of his spontaneous pneumothorax. He is being discharged home in good improving condition. He has been given instructions on smoking cessation.  Treatments: procedures: Right smallbore chest tube placement on 04/23/2014  Discharge Exam: Blood pressure 115/64, pulse 79, temperature 98 F (36.7 C), temperature source Oral, resp. rate 20, height 5\' 10"  (1.778 m), weight 65.318 kg (144 lb), SpO2 98 %. General appearance: alert, cooperative and no distress Resp: clear to auscultation bilaterally Cardio: regular rate and rhythm, S1, S2 normal, no murmur, click, rub or gallop  Disposition:  Home    Medication List    TAKE these medications        oxyCODONE-acetaminophen 5-325 MG per tablet  Commonly known as:  PERCOCET/ROXICET  Take 1-2 tablets by mouth every 4 (four) hours as needed for severe pain.           Follow-up Information    Follow up with [name],[name], MD. Schedule an appointment as soon as  possible for a visit on 05/02/2014.   Specialty:  General Surgery   Contact information:   [address] [place] [id number] [phone]       Signed: Franky MachoJENKINS,Rennae Ferraiolo A 04/26/2014, 9:20 AM

## 2014-04-26 NOTE — Progress Notes (Signed)
Discharge instructions and prescriptions given, verbalized understanding, out in stable condition ambulatory with staff. 

## 2014-05-02 ENCOUNTER — Other Ambulatory Visit (HOSPITAL_COMMUNITY): Payer: Self-pay | Admitting: General Surgery

## 2014-05-02 ENCOUNTER — Ambulatory Visit (HOSPITAL_COMMUNITY)
Admission: RE | Admit: 2014-05-02 | Discharge: 2014-05-02 | Disposition: A | Discharge status: Home / Self Care | Payer: Medicaid Other | Source: Ambulatory Visit | Attending: General Surgery | Admitting: General Surgery

## 2014-05-02 DIAGNOSIS — Z1231 Encounter for screening mammogram for malignant neoplasm of breast: Secondary | ICD-10-CM | POA: Diagnosis not present

## 2014-05-02 DIAGNOSIS — J939 Pneumothorax, unspecified: Secondary | ICD-10-CM

## 2014-07-07 ENCOUNTER — Encounter (HOSPITAL_COMMUNITY): Payer: Self-pay | Admitting: Emergency Medicine

## 2014-07-07 ENCOUNTER — Emergency Department (HOSPITAL_COMMUNITY): Payer: Medicaid Other

## 2014-07-07 ENCOUNTER — Inpatient Hospital Stay (HOSPITAL_COMMUNITY)
Admission: EM | Admit: 2014-07-07 | Discharge: 2014-07-12 | DRG: 165 | Disposition: A | Discharge status: Home / Self Care | Payer: Medicaid Other | Attending: Surgery | Admitting: Surgery

## 2014-07-07 DIAGNOSIS — J029 Acute pharyngitis, unspecified: Secondary | ICD-10-CM | POA: Diagnosis not present

## 2014-07-07 DIAGNOSIS — J939 Pneumothorax, unspecified: Secondary | ICD-10-CM

## 2014-07-07 DIAGNOSIS — J9383 Other pneumothorax: Secondary | ICD-10-CM | POA: Diagnosis not present

## 2014-07-07 DIAGNOSIS — F17210 Nicotine dependence, cigarettes, uncomplicated: Secondary | ICD-10-CM | POA: Diagnosis not present

## 2014-07-07 DIAGNOSIS — Z791 Long term (current) use of non-steroidal anti-inflammatories (NSAID): Secondary | ICD-10-CM

## 2014-07-07 DIAGNOSIS — J439 Emphysema, unspecified: Secondary | ICD-10-CM

## 2014-07-07 HISTORY — DX: Other pulmonary collapse: J98.19

## 2014-07-07 LAB — BASIC METABOLIC PANEL
Anion gap: 8 (ref 5–15)
BUN: 15 mg/dL (ref 6–20)
CALCIUM: 9.7 mg/dL (ref 8.9–10.3)
CHLORIDE: 103 mmol/L (ref 101–111)
CO2: 26 mmol/L (ref 22–32)
Creatinine, Ser: 0.8 mg/dL (ref 0.61–1.24)
Glucose, Bld: 100 mg/dL — ABNORMAL HIGH (ref 65–99)
POTASSIUM: 3.4 mmol/L — AB (ref 3.5–5.1)
SODIUM: 137 mmol/L (ref 135–145)

## 2014-07-07 LAB — CBC WITH DIFFERENTIAL/PLATELET
BLASTS: 0 %
Band Neutrophils: 0 % (ref 0–10)
Basophils Absolute: 0.2 10*3/uL — ABNORMAL HIGH (ref 0.0–0.1)
Basophils Relative: 1 % (ref 0–1)
Eosinophils Absolute: 0 10*3/uL (ref 0.0–0.7)
Eosinophils Relative: 0 % (ref 0–5)
HCT: 43.8 % (ref 39.0–52.0)
HEMOGLOBIN: 15.1 g/dL (ref 13.0–17.0)
LYMPHS PCT: 21 % (ref 12–46)
Lymphs Abs: 3.3 10*3/uL (ref 0.7–4.0)
MCH: 32.5 pg (ref 26.0–34.0)
MCHC: 34.5 g/dL (ref 30.0–36.0)
MCV: 94.4 fL (ref 78.0–100.0)
METAMYELOCYTES PCT: 0 %
MONO ABS: 0.8 10*3/uL (ref 0.1–1.0)
MONOS PCT: 5 % (ref 3–12)
Myelocytes: 0 %
NEUTROS ABS: 11.2 10*3/uL — AB (ref 1.7–7.7)
NEUTROS PCT: 73 % (ref 43–77)
OTHER: 0 %
Platelets: 260 10*3/uL (ref 150–400)
Promyelocytes Absolute: 0 %
RBC: 4.64 MIL/uL (ref 4.22–5.81)
RDW: 13.5 % (ref 11.5–15.5)
WBC: 15.5 10*3/uL — AB (ref 4.0–10.5)
nRBC: 0 /100 WBC

## 2014-07-07 MED ORDER — MORPHINE SULFATE 4 MG/ML IJ SOLN
4.0000 mg | Freq: Once | INTRAMUSCULAR | Status: AC
  Administered 2014-07-07: 4 mg via INTRAVENOUS
  Filled 2014-07-07: qty 1

## 2014-07-07 MED ORDER — MORPHINE SULFATE 4 MG/ML IJ SOLN
4.0000 mg | Freq: Once | INTRAMUSCULAR | Status: AC
  Administered 2014-07-07: 4 mg via INTRAVENOUS

## 2014-07-07 MED ORDER — LIDOCAINE-EPINEPHRINE (PF) 2 %-1:200000 IJ SOLN
INTRAMUSCULAR | Status: AC
  Administered 2014-07-07
  Filled 2014-07-07: qty 20

## 2014-07-07 MED ORDER — HYDROMORPHONE HCL 1 MG/ML IJ SOLN
1.0000 mg | Freq: Once | INTRAMUSCULAR | Status: AC
  Administered 2014-07-07: 1 mg via INTRAVENOUS
  Filled 2014-07-07: qty 1

## 2014-07-07 MED ORDER — MORPHINE SULFATE 4 MG/ML IJ SOLN
INTRAMUSCULAR | Status: AC
  Filled 2014-07-07: qty 1

## 2014-07-07 MED ORDER — SODIUM CHLORIDE 0.9 % IV SOLN
INTRAVENOUS | Status: DC
  Administered 2014-07-07: 22:00:00 via INTRAVENOUS

## 2014-07-07 MED ORDER — MORPHINE SULFATE 4 MG/ML IJ SOLN
INTRAMUSCULAR | Status: AC
  Administered 2014-07-07: 4 mg
  Filled 2014-07-07: qty 1

## 2014-07-07 NOTE — ED Notes (Signed)
Dr. Roselyn BeringJ. Knapp placed emergent chest tube to right chest. Tolerated well. Using 5529f tube, placed to low suction. Time out preformed and sterile technique used.

## 2014-07-07 NOTE — ED Notes (Signed)
Patient complaining of shortness of breath since this morning. States has worsened some since this morning. Patient reports "I had a collapsed lung to the right side a few months ago. Patient complaining of pain to right mid back.

## 2014-07-07 NOTE — ED Provider Notes (Signed)
CSN: [id number]     Arrival date & time 07/07/14  1956 History   First MD Initiated Contact with Patient 07/07/14 2012     Chief Complaint  Patient presents with  . Shortness of Breath   Patient is a 34 y.o. male presenting with shortness of breath. The history is provided by the patient.  Shortness of Breath Severity:  Moderate Onset quality:  Gradual Duration:  1 day Timing:  Constant Progression:  Worsening Relieved by:  Nothing Worsened by:  Nothing tried Associated symptoms: chest pain (right side in the back) and cough   Associated symptoms: no fever, no vomiting and no wheezing     Past Medical History  Diagnosis Date  . Collapsed lung    Past Surgical History  Procedure Laterality Date  . Appendectomy     Family History  Problem Relation Age of Onset  . Cancer Maternal Aunt    History  Substance Use Topics  . Smoking status: Current Every Day Smoker -- 1.00 packs/day for 14 years    Types: Cigarettes  . Smokeless tobacco: Not on file  . Alcohol Use: 1.2 oz/week    2 Standard drinks or equivalent per week    Review of Systems  Constitutional: Negative for fever.  Respiratory: Positive for cough and shortness of breath. Negative for wheezing.   Cardiovascular: Positive for chest pain (right side in the back).  Gastrointestinal: Negative for vomiting.  All other systems reviewed and are negative.     Allergies  Review of patient's allergies indicates no known allergies.  Home Medications   Prior to Admission medications   Medication Sig Start Date End Date Taking? Authorizing Provider  ibuprofen (ADVIL,MOTRIN) 200 MG tablet Take 200 mg by mouth every 6 (six) hours as needed for mild pain or moderate pain.   Yes Historical Provider, MD  oxyCODONE-acetaminophen (PERCOCET/ROXICET) 5-325 MG per tablet Take 1-2 tablets by mouth every 4 (four) hours as needed for severe pain. Patient not taking: Reported on 07/07/2014 04/26/14   Franky MachoMark Jenkins Md, MD   BP 116/81  mmHg  Pulse 98  Temp(Src) 98 F (36.7 C) (Oral)  Resp 20  Ht 6' (1.829 m)  Wt 143 lb (64.864 kg)  BMI 19.39 kg/m2  SpO2 98% Physical Exam  Constitutional: He appears well-developed and well-nourished. No distress.  HENT:  Head: Normocephalic and atraumatic.  Right Ear: External ear normal.  Left Ear: External ear normal.  Eyes: Conjunctivae are normal. Right eye exhibits no discharge. Left eye exhibits no discharge. No scleral icterus.  Neck: Neck supple. No tracheal deviation present.  Cardiovascular: Normal rate, regular rhythm and intact distal pulses.   Pulmonary/Chest: Effort normal. No stridor. No respiratory distress. He has decreased breath sounds in the right upper field, the right middle field and the right lower field. He has no wheezes. He has no rales. He exhibits no tenderness.  Abdominal: Soft. Bowel sounds are normal. He exhibits no distension. There is no tenderness. There is no rebound and no guarding.  Musculoskeletal: He exhibits no edema or tenderness.  Neurological: He is alert. He has normal strength. No cranial nerve deficit (no facial droop, extraocular movements intact, no slurred speech) or sensory deficit. He exhibits normal muscle tone. He displays no seizure activity. Coordination normal.  Skin: Skin is warm and dry. No rash noted.  Psychiatric: He has a normal mood and affect.  Nursing note and vitals reviewed.   ED Course  CHEST TUBE INSERTION Date/Time: 07/07/2014 11:51 PM Performed by:  Secundino Ellithorpe Authorized by: Linwood DibblesKNAPP, Kaedyn Belardo Consent: The procedure was performed in an emergent situation. Verbal consent obtained. Risks and benefits: risks, benefits and alternatives were discussed Consent given by: patient Patient identity confirmed: verbally with patient Time out: Immediately prior to procedure a "time out" was called to verify the correct patient, procedure, equipment, support staff and site/side marked as required. Indications: pneumothorax Patient  sedated: no Anesthesia: local infiltration Local anesthetic: lidocaine 2% with epinephrine Anesthetic total: 30 ml Preparation: skin prepped with Betadine Placement location: right lateral Scalpel size: 15 Tube size: 24 [place] Dissection instrument: finger and Kelly clamp Ultrasound guidance: no Tension pneumothorax heard: no Tube connected to: suction Drainage amount: 0 ml Suture material: prolene. Dressing: 4x4 sterile gauze Post-insertion x-ray findings: tube in good position Patient tolerance: Patient tolerated the procedure well with no immediate complications Comments: Less than 5 cc blood loss   (including critical care time) Labs Review Labs Reviewed  CBC WITH DIFFERENTIAL/PLATELET - Abnormal; Notable for the following:    WBC 15.5 (*)    Neutro Abs 11.2 (*)    Basophils Absolute 0.2 (*)    All other components within normal limits  BASIC METABOLIC PANEL - Abnormal; Notable for the following:    Potassium 3.4 (*)    Glucose, Bld 100 (*)    All other components within normal limits    Imaging Review Dg Chest 2 View  07/07/2014   CLINICAL DATA:  Acute onset of shortness of breath and right mid back pain. Initial encounter.  EXAM: CHEST  2 VIEW  COMPARISON:  Chest radiograph performed 05/02/2014  FINDINGS: There is a recurrent moderate to large right-sided pneumothorax, measuring nearly 50% of right lung volume. Associated atelectasis is noted at the right lung base. The left lung is unremarkable in appearance.  No pleural effusion is seen. The cardiomediastinal silhouette is normal in size, without significant mediastinal shift. No acute osseous abnormalities are seen.  IMPRESSION: Recurrent moderate to large right-sided pneumothorax, measuring nearly 50% of right lung volume, with associated atelectasis at the right lung base.  Critical Value/emergent results were called by telephone at the time of interpretation on 07/07/2014 at 9:48 pm to Dr. Linwood DibblesJON Nimisha Rathel, who verbally  acknowledged these results.   Electronically Signed   By: Roanna RaiderJeffery  Chang M.D.   On: 07/07/2014 21:51    Medications  0.9 %  sodium chloride infusion ( Intravenous New Bag/Given 07/07/14 2224)  HYDROmorphone (DILAUDID) injection 1 mg (not administered)  morphine 4 MG/ML injection 4 mg (4 mg Intravenous Given 07/07/14 2039)  lidocaine-EPINEPHrine (XYLOCAINE W/EPI) 2 %-1:200000 (PF) injection (  Given by Other 07/07/14 2335)  morphine 4 MG/ML injection (4 mg  Given 07/07/14 2224)  lidocaine-EPINEPHrine (XYLOCAINE W/EPI) 2 %-1:200000 (PF) injection (  Given by Other 07/07/14 2335)  morphine 4 MG/ML injection (  Duplicate 07/07/14 2334)  morphine 4 MG/ML injection 4 mg (4 mg Intravenous Given 07/07/14 2328)    MDM   Final diagnoses:  Spontaneous pneumothorax    Discussed with Dr Lovell SheehanJenkins.  He feels that patient should see a cardiothoracic surgeon.  Requested I place the chest tube and consult with CV surgery Silvana.  Discussed with Dr Laneta SimmersBartle.  [facility]    Linwood DibblesJon Francess Mullen, MD 07/07/14 [id number]

## 2014-07-08 ENCOUNTER — Encounter (HOSPITAL_COMMUNITY): Payer: Self-pay | Admitting: *Deleted

## 2014-07-08 ENCOUNTER — Inpatient Hospital Stay (HOSPITAL_COMMUNITY): Payer: Medicaid Other

## 2014-07-08 DIAGNOSIS — J9383 Other pneumothorax: Secondary | ICD-10-CM

## 2014-07-08 LAB — URINALYSIS, ROUTINE W REFLEX MICROSCOPIC
Bilirubin Urine: NEGATIVE
GLUCOSE, UA: NEGATIVE mg/dL
Ketones, ur: NEGATIVE mg/dL
LEUKOCYTES UA: NEGATIVE
Nitrite: NEGATIVE
PROTEIN: NEGATIVE mg/dL
SPECIFIC GRAVITY, URINE: 1.012 (ref 1.005–1.030)
Urobilinogen, UA: 0.2 mg/dL (ref 0.0–1.0)
pH: 5.5 (ref 5.0–8.0)

## 2014-07-08 LAB — URINE MICROSCOPIC-ADD ON

## 2014-07-08 LAB — PROTIME-INR
INR: 1.1 (ref 0.00–1.49)
Prothrombin Time: 14.4 seconds (ref 11.6–15.2)

## 2014-07-08 LAB — SURGICAL PCR SCREEN
MRSA, PCR: NEGATIVE
STAPHYLOCOCCUS AUREUS: POSITIVE — AB

## 2014-07-08 LAB — MRSA PCR SCREENING: MRSA BY PCR: NEGATIVE

## 2014-07-08 MED ORDER — DEXTROSE 5 % IV SOLN
1.5000 g | INTRAVENOUS | Status: AC
  Administered 2014-07-09: 1.5 g via INTRAVENOUS
  Filled 2014-07-08: qty 1.5

## 2014-07-08 MED ORDER — ENOXAPARIN SODIUM 40 MG/0.4ML ~~LOC~~ SOLN
40.0000 mg | SUBCUTANEOUS | Status: DC
  Administered 2014-07-08: 40 mg via SUBCUTANEOUS
  Filled 2014-07-08 (×2): qty 0.4

## 2014-07-08 MED ORDER — HYDROMORPHONE HCL 1 MG/ML IJ SOLN
INTRAMUSCULAR | Status: AC
  Administered 2014-07-08: 1 mg
  Filled 2014-07-08: qty 1

## 2014-07-08 MED ORDER — MORPHINE SULFATE 4 MG/ML IJ SOLN
4.0000 mg | INTRAMUSCULAR | Status: DC | PRN
  Administered 2014-07-08 (×7): 4 mg via INTRAVENOUS
  Filled 2014-07-08 (×8): qty 1

## 2014-07-08 MED ORDER — HYDROMORPHONE HCL 1 MG/ML IJ SOLN
2.0000 mg | INTRAMUSCULAR | Status: DC | PRN
  Administered 2014-07-08 – 2014-07-09 (×3): 2 mg via INTRAVENOUS
  Filled 2014-07-08 (×3): qty 2

## 2014-07-08 NOTE — Progress Notes (Addendum)
      [address]       [place] [id number]             [phone]            Subjective: Patient with a lot of pain at right chest tube site.  Objective: Vital signs in last 24 hours: Temp:  [98 F (36.7 C)-98.3 F (36.8 C)] 98.2 F (36.8 C) (05/16 0432) Pulse Rate:  [90-107] 90 (05/16 0432) Cardiac Rhythm:  [-] Normal sinus rhythm (05/16 0332) Resp:  [18-20] 18 (05/16 0432) BP: (116-135)/(75-88) 124/75 mmHg (05/16 0432) SpO2:  [92 %-98 %] 95 % (05/16 0432) Weight:  [143 lb (64.864 kg)] 143 lb (64.864 kg) (05/15 2002)      Physical Exam:  Cardiovascular: RRR Pulmonary: Clear to auscultation bilaterally; no rales, wheezes, or rhonchi Chest Tube: to suction and no air leak  Lab Results: CBC: Recent Labs  07/07/14 2052  WBC 15.5*  HGB 15.1  HCT 43.8  PLT 260   BMET:  Recent Labs  07/07/14 2052  NA 137  K 3.4*  CL 103  CO2 26  GLUCOSE 100*  BUN 15  CREATININE 0.80  CALCIUM 9.7    PT/INR: No results for input(s): LABPROT, INR in the last 72 hours. ABG:  INR: Will add last result for INR, ABG once components are confirmed Will add last 4 CBG results once components are confirmed  Assessment/Plan:  1. CV - SR in the 90's. 2.  Pulmonary - On room air. Chest tube is to suction. There is no air leak. Chest tube to continue to suction. Will order a CT scan of the chest. May need right VATS, stapling blebs in am. 3. Will order diet   ZIMMERMAN,DONIELLE MPA-C 07/08/2014,7:57 AM   Chart reviewed, patient examined, agree with above. 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 Will plan right VATS for bleb staping and pleurodesis tomorrow. I discussed the procedure with him including alternative of continue chest tube drainage, benefits, and risks including but not limited to bleeding, infection, persistent air leak, and recurrent ptx. He understands and agrees to proceed. Will do tomorrow.

## 2014-07-08 NOTE — Progress Notes (Signed)
Utilization review completed.  

## 2014-07-08 NOTE — H&P (Signed)
[address]       [place] [address]             [phone]      Cardiothoracic Surgery Admission History and Physical    [name] is an 34 y.o. male.   Chief Complaint: Recurrent spontaneous right pneumothorax HPI:   The patient is a 34 year old smoker who had a spontaneous right ptx treated by Dr. Arnoldo Morale at [facility] on 3/1/22016 with a right chest tube. The ptx resolved and the tube was removed after a few days. He has continued to smoke and now presented again to [facility] last night with a second spontaneous right ptx. He had a right chest tube inserted by the ER physician and I was called to transfer the patient for further management.  Past Medical History  Diagnosis Date  . Collapsed lung right side 04/22/2014    Past Surgical History  Procedure Laterality Date  . Appendectomy      Family History  Problem Relation Age of Onset  . Cancer Maternal Aunt    Social History:  reports that he has been smoking Cigarettes.  He has a 14 pack-year smoking history. He does not have any smokeless tobacco history on file. He reports that he drinks about 1.2 oz of alcohol per week. He reports that he does not use illicit drugs.  Allergies: No Known Allergies  Medications Prior to Admission  Medication Sig Dispense Refill  . ibuprofen (ADVIL,MOTRIN) 200 MG tablet Take 200 mg by mouth every 6 (six) hours as needed for mild pain or moderate pain.    Marland Kitchen oxyCODONE-acetaminophen (PERCOCET/ROXICET) 5-325 MG per tablet Take 1-2 tablets by mouth every 4 (four) hours as needed for severe pain. (Patient not taking: Reported on 07/07/2014) 30 tablet 0    Results for orders placed or performed during the hospital encounter of 07/07/14 (from the past 48 hour(s))  CBC with Differential     Status: Abnormal   Collection Time: 07/07/14  8:52 PM  Result Value Ref Range   WBC 15.5 (H) 4.0 - 10.5 K/uL   RBC 4.64 4.22 - 5.81 MIL/uL   Hemoglobin 15.1 13.0 - 17.0  g/dL   HCT 43.8 39.0 - 52.0 %   MCV 94.4 78.0 - 100.0 fL   MCH 32.5 26.0 - 34.0 pg   MCHC 34.5 30.0 - 36.0 g/dL   RDW 13.5 11.5 - 15.5 %   Platelets 260 150 - 400 K/uL   Neutrophils Relative % 73 43 - 77 %   Lymphocytes Relative 21 12 - 46 %   Monocytes Relative 5 3 - 12 %   Eosinophils Relative 0 0 - 5 %   Basophils Relative 1 0 - 1 %   Band Neutrophils 0 0 - 10 %   Metamyelocytes Relative 0 %   Myelocytes 0 %   Promyelocytes Absolute 0 %   Blasts 0 %   nRBC 0 0 /100 WBC   Other 0 %   Neutro Abs 11.2 (H) 1.7 - 7.7 K/uL   Lymphs Abs 3.3 0.7 - 4.0 K/uL   Monocytes Absolute 0.8 0.1 - 1.0 K/uL   Eosinophils Absolute 0.0 0.0 - 0.7 K/uL   Basophils Absolute 0.2 (H) 0.0 - 0.1 K/uL  Basic metabolic panel     Status: Abnormal   Collection Time: 07/07/14  8:52 PM  Result Value Ref Range   Sodium 137 135 - 145 mmol/L   Potassium  3.4 (L) 3.5 - 5.1 mmol/L   Chloride 103 101 - 111 mmol/L   CO2 26 22 - 32 mmol/L   Glucose, Bld 100 (H) 65 - 99 mg/dL   BUN 15 6 - 20 mg/dL   Creatinine, Ser 0.80 0.61 - 1.24 mg/dL   Calcium 9.7 8.9 - 10.3 mg/dL   GFR calc non Af Amer >60 >60 mL/min   GFR calc Af Amer >60 >60 mL/min    Comment: (NOTE) The eGFR has been calculated using the CKD EPI equation. This calculation has not been validated in all clinical situations. eGFR's persistently <60 mL/min signify possible Chronic Kidney Disease.    Anion gap 8 5 - 15  MRSA PCR Screening     Status: None   Collection Time: 07/08/14  3:00 AM  Result Value Ref Range   MRSA by PCR NEGATIVE NEGATIVE    Comment:        The GeneXpert MRSA Assay (FDA approved for NASAL specimens only), is one component of a comprehensive MRSA colonization surveillance program. It is not intended to diagnose MRSA infection nor to guide or monitor treatment for MRSA infections.    Dg Chest 2 View  07/07/2014   CLINICAL DATA:  Acute onset of shortness of breath and right mid back pain. Initial encounter.  EXAM: CHEST  2  VIEW  COMPARISON:  Chest radiograph performed 05/02/2014  FINDINGS: There is a recurrent moderate to large right-sided pneumothorax, measuring nearly 50% of right lung volume. Associated atelectasis is noted at the right lung base. The left lung is unremarkable in appearance.  No pleural effusion is seen. The cardiomediastinal silhouette is normal in size, without significant mediastinal shift. No acute osseous abnormalities are seen.  IMPRESSION: Recurrent moderate to large right-sided pneumothorax, measuring nearly 50% of right lung volume, with associated atelectasis at the right lung base.  Critical Value/emergent results were called by telephone at the time of interpretation on 07/07/2014 at 9:48 pm to Dr. Dorie Rank, who verbally acknowledged these results.   Electronically Signed   By: Garald Balding M.D.   On: 07/07/2014 21:51   Ct Chest Wo Contrast  07/08/2014   CLINICAL DATA:  34 year old male with a history of bullous emphysema and recurrent right-sided pneumothorax. Prior chest tube 07/07/2014  EXAM: CT CHEST WITHOUT CONTRAST  TECHNIQUE: Multidetector CT imaging of the chest was performed following the standard protocol without IV contrast.  COMPARISON:  Chest x-ray 07/07/2014, 05/02/2014, 04/26/2014  FINDINGS: Chest:  Myofacial/ subcutaneous gas involving the right-sided chest soft tissues. Large bore thoracostomy tube traverses the space between ribs 5 and 6, directed towards the apex.  No axillary adenopathy.  No supraclavicular adenopathy.  Unremarkable appearance of the thoracic inlet.  Central airways are clear.  No debris within the trachea.  Small amount of pneumomediastinum involving the anterior mediastinum.  Heart size within normal limits.  No pericardial fluid/ thickening.  Several mediastinal lymph nodes are present, none of which are enlarged by CT size criteria.  No hiatal hernia.  Advanced bolus disease at the right apex. Changes of paraseptal/ centrilobular emphysema of the bilateral  lung apices.  Atelectatic changes involving the dependent lung bases. Platelike atelectasis of the right middle lobe.  Cylindrical bronchiectasis bilaterally.  No large right-sided pneumothorax.  Abdomen:  There are a few lock heals of gas associated with the right chest wall overlying the right liver lobe. It is uncertain if these are intraperitoneal or extraperitoneal.  Otherwise unremarkable appearance of the upper abdomen.  No  displaced fracture.  No significant degenerative changes.  IMPRESSION: CT demonstrates extensive bullous disease of the right apex, with advanced centrilobular/paraseptal emphysema involving the bilateral upper lobes.  No evidence of residual pneumothorax with large-bore right sided thoracostomy tube traversing the pleural space at the rib space of 5 and 6.  Myofacial/ subcutaneous gas involving the right chest wall, including a few locules of gas lateral to the right liver lobe, favored to be extraperitoneal within the chest wall.  Signed,  Dulcy Fanny. Earleen Newport, DO  [facility]  [facility]   Electronically Signed   By: Corrie Mckusick D.O.   On: 07/08/2014 09:39   Dg Chest Portable 1 View  07/08/2014   CLINICAL DATA:  Status post chest tube insertion for right-sided pneumothorax. Shortness of breath. Initial encounter.  EXAM: PORTABLE CHEST - 1 VIEW  COMPARISON:  Chest radiograph performed earlier today at 9:25 p.m.  FINDINGS: There has been interval placement of a right-sided chest tube, with successful re-expansion of the right lung. No definite residual pneumothorax is seen. Underlying persistent right basilar airspace opacity may reflect atelectasis or possibly pneumonia. The left lung appears clear. No pleural effusion is identified.  The cardiomediastinal silhouette is normal in size. No acute osseous abnormalities are seen. Minimal soft tissue air is noted at the site of chest tube insertion.  IMPRESSION: Interval placement of right-sided  chest tube, with successful re-expansion of the right lung. No definite residual pneumothorax seen. Underlying persistent right basilar airspace opacity may reflect atelectasis or possibly pneumonia. If the patient has symptoms of pneumonia, would treat for pneumonia, then perform follow-up chest radiograph in 3-4 weeks to ensure resolution of airspace opacity.   Electronically Signed   By: Garald Balding M.D.   On: 07/08/2014 00:35    Review of Systems  Constitutional: Negative for fever, chills, weight loss and malaise/fatigue.  HENT: Negative.   Eyes: Negative.   Respiratory: Positive for cough and shortness of breath. Negative for hemoptysis and sputum production.   Cardiovascular: Positive for chest pain. Negative for orthopnea and PND.  Gastrointestinal: Negative.   Genitourinary: Negative.   Musculoskeletal: Positive for back pain.  Skin: Negative.   Neurological: Negative.   Endo/Heme/Allergies: Negative.   Psychiatric/Behavioral: Negative.     Blood pressure 125/78, pulse 90, temperature 98.8 F (37.1 C), temperature source Oral, resp. rate 18, height 6' (1.829 m), weight 143 lb (64.864 kg), SpO2 95 %. Physical Exam  Constitutional: He is oriented to person, place, and time. He appears well-developed and well-nourished.  Uncomfortable   HENT:  Head: Normocephalic and atraumatic.  Mouth/Throat: Oropharynx is clear and moist.  Eyes: EOM are normal. Pupils are equal, round, and reactive to light.  Neck: Normal range of motion. Neck supple. No tracheal deviation present. No thyromegaly present.  Cardiovascular: Normal rate, regular rhythm and normal heart sounds.   No murmur heard. Respiratory: Effort normal and breath sounds normal. No respiratory distress. He has no wheezes. He has no rales. He exhibits no tenderness.  GI: Soft. Bowel sounds are normal. He exhibits no distension and no mass. There is no tenderness.  Musculoskeletal: Normal range of motion. He exhibits no edema.   Lymphadenopathy:    He has no cervical adenopathy.  Neurological: He is alert and oriented to person, place, and time.  Skin: Skin is warm and dry.  Psychiatric: He has a normal mood and affect.     Assessment/Plan  He had a second episode of spontaneous right pneumothorax in the  past 2 months. He will probably need a VATS for bleb stapling and pleurodesis to decrease the risk of this happening again. I discussed the option of continued chest tube drainage and the 50% chance of this happening again in the next year without surgery. He is in agreement with surgical treatment.  He is being admitted for chest tube management and we will get a CT of the chest to evaluate the lungs for blebs. I discussed the importance of complete smoking cessation with the patient and his significant other.   Gaye Pollack 07/08/2014, 3:27 PM

## 2014-07-09 ENCOUNTER — Encounter (HOSPITAL_COMMUNITY): Payer: Self-pay | Admitting: Anesthesiology

## 2014-07-09 ENCOUNTER — Encounter (HOSPITAL_COMMUNITY)
Admission: EM | Disposition: A | Discharge status: Home / Self Care | Payer: Self-pay | Source: Home / Self Care | Attending: Surgery

## 2014-07-09 ENCOUNTER — Inpatient Hospital Stay (HOSPITAL_COMMUNITY): Payer: Medicaid Other | Admitting: Anesthesiology

## 2014-07-09 ENCOUNTER — Inpatient Hospital Stay (HOSPITAL_COMMUNITY): Payer: Medicaid Other

## 2014-07-09 DIAGNOSIS — J9383 Other pneumothorax: Secondary | ICD-10-CM

## 2014-07-09 HISTORY — PX: STAPLING OF BLEBS: SHX6429

## 2014-07-09 HISTORY — PX: VIDEO ASSISTED THORACOSCOPY: SHX5073

## 2014-07-09 LAB — GLUCOSE, CAPILLARY
Glucose-Capillary: 92 mg/dL (ref 65–99)
Glucose-Capillary: 97 mg/dL (ref 65–99)
Glucose-Capillary: 108 mg/dL — ABNORMAL HIGH (ref 65–99)

## 2014-07-09 SURGERY — VIDEO ASSISTED THORACOSCOPY
Anesthesia: General | Laterality: Right

## 2014-07-09 MED ORDER — PROPOFOL 10 MG/ML IV BOLUS
INTRAVENOUS | Status: AC
  Filled 2014-07-09: qty 20

## 2014-07-09 MED ORDER — ONDANSETRON HCL 4 MG/2ML IJ SOLN
INTRAMUSCULAR | Status: DC | PRN
  Administered 2014-07-09 (×2): 4 mg via INTRAVENOUS

## 2014-07-09 MED ORDER — GLYCOPYRROLATE 0.2 MG/ML IJ SOLN
INTRAMUSCULAR | Status: DC | PRN
  Administered 2014-07-09: 0.6 mg via INTRAVENOUS

## 2014-07-09 MED ORDER — CHLORHEXIDINE GLUCONATE CLOTH 2 % EX PADS
6.0000 | MEDICATED_PAD | Freq: Every day | CUTANEOUS | Status: DC
  Administered 2014-07-09 – 2014-07-12 (×4): 6 via TOPICAL

## 2014-07-09 MED ORDER — KCL IN DEXTROSE-NACL 20-5-0.45 MEQ/L-%-% IV SOLN
INTRAVENOUS | Status: DC
  Administered 2014-07-09: 14:00:00 via INTRAVENOUS
  Administered 2014-07-10: 100 mL via INTRAVENOUS
  Filled 2014-07-09 (×4): qty 1000

## 2014-07-09 MED ORDER — FENTANYL 10 MCG/ML IV SOLN
INTRAVENOUS | Status: DC
  Administered 2014-07-09: 135 ug/h via INTRAVENOUS
  Administered 2014-07-09: 14:00:00 via INTRAVENOUS
  Administered 2014-07-09: 60 ug via INTRAVENOUS
  Administered 2014-07-10: 150 ug via INTRAVENOUS
  Administered 2014-07-10: 165 ug via INTRAVENOUS
  Administered 2014-07-10: 255 ug via INTRAVENOUS
  Administered 2014-07-10: 75 ug via INTRAVENOUS
  Administered 2014-07-10: 225 ug via INTRAVENOUS
  Administered 2014-07-10: 74 ug via INTRAVENOUS
  Administered 2014-07-11: 330 ug via INTRAVENOUS
  Administered 2014-07-11: 135 ug via INTRAVENOUS
  Administered 2014-07-11: 05:00:00 via INTRAVENOUS
  Filled 2014-07-09 (×4): qty 50

## 2014-07-09 MED ORDER — KCL IN DEXTROSE-NACL 20-5-0.45 MEQ/L-%-% IV SOLN
INTRAVENOUS | Status: AC
  Administered 2014-07-09: 1000 mL
  Filled 2014-07-09: qty 1000

## 2014-07-09 MED ORDER — KETOROLAC TROMETHAMINE 15 MG/ML IJ SOLN
15.0000 mg | Freq: Four times a day (QID) | INTRAMUSCULAR | Status: DC | PRN
  Administered 2014-07-10: 15 mg via INTRAVENOUS
  Filled 2014-07-09: qty 1

## 2014-07-09 MED ORDER — DIPHENHYDRAMINE HCL 50 MG/ML IJ SOLN: 12.5000 mg | Freq: Four times a day (QID) | INTRAMUSCULAR | Status: DC | PRN

## 2014-07-09 MED ORDER — LEVALBUTEROL HCL 0.63 MG/3ML IN NEBU
0.6300 mg | INHALATION_SOLUTION | Freq: Three times a day (TID) | RESPIRATORY_TRACT | Status: DC
  Administered 2014-07-10: 0.63 mg via RESPIRATORY_TRACT
  Filled 2014-07-09 (×4): qty 3

## 2014-07-09 MED ORDER — ARTIFICIAL TEARS OP OINT
TOPICAL_OINTMENT | OPHTHALMIC | Status: AC
  Filled 2014-07-09: qty 3.5

## 2014-07-09 MED ORDER — FENTANYL CITRATE (PF) 250 MCG/5ML IJ SOLN
INTRAMUSCULAR | Status: AC
  Filled 2014-07-09: qty 5

## 2014-07-09 MED ORDER — HYDROMORPHONE HCL 1 MG/ML IJ SOLN
0.2500 mg | INTRAMUSCULAR | Status: DC | PRN
  Administered 2014-07-09: 1 mg via INTRAVENOUS

## 2014-07-09 MED ORDER — BISACODYL 5 MG PO TBEC
10.0000 mg | DELAYED_RELEASE_TABLET | Freq: Every day | ORAL | Status: DC
  Administered 2014-07-10 – 2014-07-11 (×2): 10 mg via ORAL
  Filled 2014-07-09 (×3): qty 2

## 2014-07-09 MED ORDER — NEOSTIGMINE METHYLSULFATE 10 MG/10ML IV SOLN
INTRAVENOUS | Status: DC | PRN
  Administered 2014-07-09: 5 mg via INTRAVENOUS

## 2014-07-09 MED ORDER — LACTATED RINGERS IV SOLN
INTRAVENOUS | Status: DC
  Administered 2014-07-09 (×3): via INTRAVENOUS

## 2014-07-09 MED ORDER — FENTANYL CITRATE (PF) 100 MCG/2ML IJ SOLN
INTRAMUSCULAR | Status: AC
  Administered 2014-07-09 (×2): 100 ug via INTRAVENOUS
  Administered 2014-07-09 (×4): 50 ug via INTRAVENOUS
  Filled 2014-07-09: qty 2

## 2014-07-09 MED ORDER — SENNOSIDES-DOCUSATE SODIUM 8.6-50 MG PO TABS
1.0000 | ORAL_TABLET | Freq: Every day | ORAL | Status: DC
  Administered 2014-07-09 – 2014-07-11 (×3): 1 via ORAL
  Filled 2014-07-09 (×4): qty 1

## 2014-07-09 MED ORDER — POTASSIUM CHLORIDE 10 MEQ/50ML IV SOLN
10.0000 meq | Freq: Every day | INTRAVENOUS | Status: DC | PRN
  Filled 2014-07-09: qty 50

## 2014-07-09 MED ORDER — ONDANSETRON HCL 4 MG/2ML IJ SOLN: 4.0000 mg | Freq: Four times a day (QID) | INTRAMUSCULAR | Status: DC | PRN

## 2014-07-09 MED ORDER — LIDOCAINE HCL (CARDIAC) 20 MG/ML IV SOLN
INTRAVENOUS | Status: AC
  Filled 2014-07-09: qty 10

## 2014-07-09 MED ORDER — DEXAMETHASONE SODIUM PHOSPHATE 4 MG/ML IJ SOLN
INTRAMUSCULAR | Status: DC | PRN
  Administered 2014-07-09: 8 mg via INTRAVENOUS

## 2014-07-09 MED ORDER — ACETAMINOPHEN 160 MG/5ML PO SOLN
1000.0000 mg | Freq: Four times a day (QID) | ORAL | Status: DC
  Filled 2014-07-09: qty 40

## 2014-07-09 MED ORDER — VECURONIUM BROMIDE 10 MG IV SOLR
INTRAVENOUS | Status: DC | PRN
  Administered 2014-07-09: 2 mg via INTRAVENOUS

## 2014-07-09 MED ORDER — LIDOCAINE HCL (CARDIAC) 20 MG/ML IV SOLN
INTRAVENOUS | Status: DC | PRN
  Administered 2014-07-09: 20 mg via INTRAVENOUS
  Administered 2014-07-09: 50 mg via INTRAVENOUS
  Administered 2014-07-09: 80 mg via INTRAVENOUS

## 2014-07-09 MED ORDER — HYDROMORPHONE HCL 1 MG/ML IJ SOLN
INTRAMUSCULAR | Status: AC
Start: 2014-07-09 — End: 2014-07-10
  Filled 2014-07-09: qty 1

## 2014-07-09 MED ORDER — ENOXAPARIN SODIUM 40 MG/0.4ML ~~LOC~~ SOLN
40.0000 mg | Freq: Every day | SUBCUTANEOUS | Status: DC
  Administered 2014-07-10 – 2014-07-12 (×3): 40 mg via SUBCUTANEOUS
  Filled 2014-07-09 (×3): qty 0.4

## 2014-07-09 MED ORDER — DEXTROSE 5 % IV SOLN
1.5000 g | Freq: Two times a day (BID) | INTRAVENOUS | Status: AC
  Administered 2014-07-09 – 2014-07-10 (×2): 1.5 g via INTRAVENOUS
  Filled 2014-07-09 (×3): qty 1.5

## 2014-07-09 MED ORDER — PROMETHAZINE HCL 25 MG/ML IJ SOLN: 6.2500 mg | INTRAMUSCULAR | Status: DC | PRN

## 2014-07-09 MED ORDER — ONDANSETRON HCL 4 MG/2ML IJ SOLN
4.0000 mg | Freq: Four times a day (QID) | INTRAMUSCULAR | Status: DC | PRN
Start: 2014-07-09 — End: 2014-07-12

## 2014-07-09 MED ORDER — ROCURONIUM BROMIDE 100 MG/10ML IV SOLN
INTRAVENOUS | Status: DC | PRN
  Administered 2014-07-09: 50 mg via INTRAVENOUS

## 2014-07-09 MED ORDER — MIDAZOLAM HCL 2 MG/2ML IJ SOLN
INTRAMUSCULAR | Status: AC
  Filled 2014-07-09: qty 2

## 2014-07-09 MED ORDER — NALOXONE HCL 0.4 MG/ML IJ SOLN: 0.4000 mg | INTRAMUSCULAR | Status: DC | PRN

## 2014-07-09 MED ORDER — OXYCODONE HCL 5 MG PO TABS
5.0000 mg | ORAL_TABLET | ORAL | Status: DC | PRN
  Administered 2014-07-10: 10 mg via ORAL
  Administered 2014-07-10: 5 mg via ORAL
  Administered 2014-07-11 – 2014-07-12 (×6): 10 mg via ORAL
  Filled 2014-07-09: qty 1
  Filled 2014-07-09 (×7): qty 2

## 2014-07-09 MED ORDER — SODIUM CHLORIDE 0.9 % IJ SOLN: 9.0000 mL | INTRAMUSCULAR | Status: DC | PRN

## 2014-07-09 MED ORDER — PROPOFOL 10 MG/ML IV BOLUS
INTRAVENOUS | Status: DC | PRN
  Administered 2014-07-09: 200 mg via INTRAVENOUS

## 2014-07-09 MED ORDER — INSULIN ASPART 100 UNIT/ML ~~LOC~~ SOLN: 0.0000 [IU] | SUBCUTANEOUS | Status: DC

## 2014-07-09 MED ORDER — ARTIFICIAL TEARS OP OINT
TOPICAL_OINTMENT | OPHTHALMIC | Status: DC | PRN
Start: 2014-07-09 — End: 2014-07-09
  Administered 2014-07-09: 1 via OPHTHALMIC

## 2014-07-09 MED ORDER — NEOSTIGMINE METHYLSULFATE 10 MG/10ML IV SOLN
INTRAVENOUS | Status: AC
  Filled 2014-07-09: qty 2

## 2014-07-09 MED ORDER — DIPHENHYDRAMINE HCL 12.5 MG/5ML PO ELIX
12.5000 mg | ORAL_SOLUTION | Freq: Four times a day (QID) | ORAL | Status: DC | PRN
  Filled 2014-07-09: qty 5

## 2014-07-09 MED ORDER — MUPIROCIN 2 % EX OINT
1.0000 "application " | TOPICAL_OINTMENT | Freq: Two times a day (BID) | CUTANEOUS | Status: DC
  Administered 2014-07-09 – 2014-07-12 (×6): 1 via NASAL
  Filled 2014-07-09: qty 22

## 2014-07-09 MED ORDER — 0.9 % SODIUM CHLORIDE (POUR BTL) OPTIME
TOPICAL | Status: DC | PRN
  Administered 2014-07-09: 1000 mL

## 2014-07-09 MED ORDER — MIDAZOLAM HCL 5 MG/5ML IJ SOLN
INTRAMUSCULAR | Status: DC | PRN
  Administered 2014-07-09: 2 mg via INTRAVENOUS

## 2014-07-09 MED ORDER — LEVALBUTEROL HCL 0.63 MG/3ML IN NEBU
0.6300 mg | INHALATION_SOLUTION | Freq: Four times a day (QID) | RESPIRATORY_TRACT | Status: DC
  Administered 2014-07-09: 0.63 mg via RESPIRATORY_TRACT
  Filled 2014-07-09: qty 3

## 2014-07-09 MED ORDER — ACETAMINOPHEN 500 MG PO TABS
1000.0000 mg | ORAL_TABLET | Freq: Four times a day (QID) | ORAL | Status: DC
  Administered 2014-07-09 – 2014-07-12 (×12): 1000 mg via ORAL
  Filled 2014-07-09 (×19): qty 2

## 2014-07-09 MED ORDER — ONDANSETRON HCL 4 MG/2ML IJ SOLN
INTRAMUSCULAR | Status: AC
  Filled 2014-07-09: qty 6

## 2014-07-09 SURGICAL SUPPLY — 58 items
CANISTER SUCTION 2500CC (MISCELLANEOUS) ×6 IMPLANT
CATH KIT ON Q 5IN SLV (PAIN MANAGEMENT) IMPLANT
CATH THORACIC 28FR (CATHETERS) IMPLANT
CATH THORACIC 36FR (CATHETERS) IMPLANT
CATH THORACIC 36FR RT ANG (CATHETERS) IMPLANT
CONT SPEC 4OZ CLIKSEAL STRL BL (MISCELLANEOUS) ×6 IMPLANT
COVER SURGICAL LIGHT HANDLE (MISCELLANEOUS) ×6 IMPLANT
DERMABOND ADVANCED (GAUZE/BANDAGES/DRESSINGS) ×2
DERMABOND ADVANCED .7 DNX12 (GAUZE/BANDAGES/DRESSINGS) ×1 IMPLANT
DRAPE CAMERA VIDEO/LASER (DRAPES) IMPLANT
DRAPE LAPAROSCOPIC ABDOMINAL (DRAPES) ×3 IMPLANT
DRAPE WARM FLUID 44X44 (DRAPE) ×3 IMPLANT
ELECT REM PT RETURN 9FT ADLT (ELECTROSURGICAL) ×3
ELECTRODE REM PT RTRN 9FT ADLT (ELECTROSURGICAL) ×1 IMPLANT
GAUZE SPONGE 4X4 12PLY STRL (GAUZE/BANDAGES/DRESSINGS) ×3 IMPLANT
GLOVE EUDERMIC 7 POWDERFREE (GLOVE) ×6 IMPLANT
GOWN STRL REUS W/ TWL LRG LVL3 (GOWN DISPOSABLE) ×2 IMPLANT
GOWN STRL REUS W/ TWL XL LVL3 (GOWN DISPOSABLE) ×1 IMPLANT
GOWN STRL REUS W/TWL LRG LVL3 (GOWN DISPOSABLE) ×4
GOWN STRL REUS W/TWL XL LVL3 (GOWN DISPOSABLE) ×2
HANDLE STAPLE ENDO GIA SHORT (STAPLE) ×2
KIT BASIN OR (CUSTOM PROCEDURE TRAY) ×3 IMPLANT
KIT ROOM TURNOVER OR (KITS) ×3 IMPLANT
NS IRRIG 1000ML POUR BTL (IV SOLUTION) ×6 IMPLANT
PACK CHEST (CUSTOM PROCEDURE TRAY) ×3 IMPLANT
PAD ARMBOARD 7.5X6 YLW CONV (MISCELLANEOUS) ×6 IMPLANT
POUCH SPECIMEN RETRIEVAL 10MM (ENDOMECHANICALS) ×6 IMPLANT
RELOAD EGIA 60 MED/THCK PURPLE (STAPLE) ×21 IMPLANT
SEALANT SURG COSEAL 4ML (VASCULAR PRODUCTS) IMPLANT
SEALANT SURG COSEAL 8ML (VASCULAR PRODUCTS) IMPLANT
SOLUTION ANTI FOG 6CC (MISCELLANEOUS) IMPLANT
SPECIMEN JAR MEDIUM (MISCELLANEOUS) ×3 IMPLANT
STAPLER ENDO GIA 12MM SHORT (STAPLE) ×1 IMPLANT
SUT PROLENE 3 0 SH DA (SUTURE) IMPLANT
SUT PROLENE 4 0 RB 1 (SUTURE)
SUT PROLENE 4-0 RB1 .5 CRCL 36 (SUTURE) IMPLANT
SUT SILK  1 MH (SUTURE)
SUT SILK 1 MH (SUTURE) IMPLANT
SUT SILK 2 0SH CR/8 30 (SUTURE) IMPLANT
SUT VIC AB 1 CTX 36 (SUTURE)
SUT VIC AB 1 CTX36XBRD ANBCTR (SUTURE) IMPLANT
SUT VIC AB 2 TP1 27 (SUTURE) IMPLANT
SUT VIC AB 2-0 CT1 27 (SUTURE)
SUT VIC AB 2-0 CT1 TAPERPNT 27 (SUTURE) IMPLANT
SUT VIC AB 2-0 CTX 36 (SUTURE) IMPLANT
SUT VIC AB 2-0 UR6 27 (SUTURE) IMPLANT
SUT VIC AB 3-0 MH 27 (SUTURE) IMPLANT
SUT VIC AB 3-0 SH 27 (SUTURE)
SUT VIC AB 3-0 SH 27X BRD (SUTURE) IMPLANT
SUT VIC AB 3-0 X1 27 (SUTURE) IMPLANT
SYSTEM SAHARA CHEST DRAIN ATS (WOUND CARE) ×3 IMPLANT
TAPE CLOTH SURG 4X10 WHT LF (GAUZE/BANDAGES/DRESSINGS) ×3 IMPLANT
TIP APPLICATOR SPRAY EXTEND 16 (VASCULAR PRODUCTS) IMPLANT
TOWEL OR 17X24 6PK STRL BLUE (TOWEL DISPOSABLE) ×3 IMPLANT
TOWEL OR 17X26 10 PK STRL BLUE (TOWEL DISPOSABLE) ×3 IMPLANT
TRAP SPECIMEN MUCOUS 40CC (MISCELLANEOUS) IMPLANT
TRAY FOLEY CATH 14FRSI W/METER (CATHETERS) ×3 IMPLANT
WATER STERILE IRR 1000ML POUR (IV SOLUTION) ×6 IMPLANT

## 2014-07-09 NOTE — Brief Op Note (Signed)
07/07/2014 - 07/09/2014  12:38 PM  PATIENT:  Andre CaveJoseph Dorsey  34 y.o. male  PRE-OPERATIVE DIAGNOSIS:  Recurrent right spontaneous ptx  POST-OPERATIVE DIAGNOSIS: Same  PROCEDURE:  Procedure(s): VIDEO ASSISTED THORACOSCOPY (Right) STAPLING OF BLEBS (Right)  SURGEON:  Surgeon(s) and Role:    * Alleen BorneBryan K Mikele Sifuentes, MD - Primary  PHYSICIAN ASSISTANT: Doree Fudgeonielle Zimmerman, PA-C    ANESTHESIA:   general  EBL:  Total I/O In: 1500 [I.V.:1500] Out: 250 [Urine:250]  BLOOD ADMINISTERED:none  DRAINS: 1 28 F Chest Tube(s) in the right pleural space   LOCAL MEDICATIONS USED:  NONE  SPECIMEN:  Source of Specimen:  wedge resection of right upper lobe bullae  DISPOSITION OF SPECIMEN:  PATHOLOGY  COUNTS:  YES  TOURNIQUET:  * No tourniquets in log *  DICTATION: .Note written in EPIC  PLAN OF CARE: Admit to inpatient   PATIENT DISPOSITION:  PACU - hemodynamically stable.   Delay start of Pharmacological VTE agent (>24hrs) due to surgical blood loss or risk of bleeding: yes

## 2014-07-09 NOTE — Transfer of Care (Signed)
Immediate Anesthesia Transfer of Care Note  Patient: [name]  Procedure(s) Performed: Procedure(s): VIDEO ASSISTED THORACOSCOPY (Right) STAPLING OF BLEBS (Right)  Patient Location: PACU  Anesthesia Type:General  Level of Consciousness: oriented, sedated, patient cooperative and responds to stimulation  Airway & Oxygen Therapy: Patient Spontanous Breathing and Patient connected to nasal cannula oxygen  Post-op Assessment: Report given to RN, Post -op Vital signs reviewed and stable, Patient moving all extremities and Patient moving all extremities X 4  Post vital signs: Reviewed and stable  Last Vitals:  Filed Vitals:   07/09/14 0507  BP: 125/83  Pulse: 90  Temp: 37.1 C  Resp: 18    Complications: No apparent anesthesia complications

## 2014-07-09 NOTE — Anesthesia Preprocedure Evaluation (Signed)
Anesthesia Evaluation  Patient identified by MRN, date of birth, ID band Patient awake    Reviewed: Allergy & Precautions, NPO status , Patient's Chart, lab work & pertinent test results  Airway Mallampati: II   Neck ROM: Full  Mouth opening: Limited Mouth Opening  Dental  (+) Teeth Intact, Poor Dentition   Pulmonary Current Smoker,  Pulm blebs  + rhonchi         Cardiovascular negative cardio ROS  Rhythm:Regular Rate:Normal     Neuro/Psych negative neurological ROS     GI/Hepatic negative GI ROS, Neg liver ROS,   Endo/Other  negative endocrine ROS  Renal/GU negative Renal ROS     Musculoskeletal   Abdominal   Peds  Hematology   Anesthesia Other Findings   Reproductive/Obstetrics                             Anesthesia Physical Anesthesia Plan  ASA: II  Anesthesia Plan: General   Post-op Pain Management:    Induction: Intravenous  Airway Management Planned: Double Lumen EBT  Additional Equipment: Arterial line  Intra-op Plan:   Post-operative Plan: Extubation in OR  Informed Consent: I have reviewed the patients History and Physical, chart, labs and discussed the procedure including the risks, benefits and alternatives for the proposed anesthesia with the patient or authorized representative who has indicated his/her understanding and acceptance.   Dental advisory given  Plan Discussed with: CRNA and Surgeon  Anesthesia Plan Comments:         Anesthesia Quick Evaluation

## 2014-07-09 NOTE — Anesthesia Procedure Notes (Signed)
Procedure Name: Intubation Date/Time: 07/09/2014 10:51 AM Performed by: Wray KearnsFOLEY, Andre Meckel A Pre-anesthesia Checklist: Emergency Drugs available, Patient identified, Timeout performed, Suction available and Patient being monitored Patient Re-evaluated:Patient Re-evaluated prior to inductionOxygen Delivery Method: Circle system utilized Preoxygenation: Pre-oxygenation with 100% oxygen Intubation Type: IV induction and Cricoid Pressure applied Ventilation: Mask ventilation without difficulty Laryngoscope Size: Mac and 3 Grade View: Grade I Tube type: Oral Endobronchial tube: Left, EBT position confirmed by auscultation, EBT position confirmed by fiberoptic bronchoscope and Double lumen EBT and 37 Fr Number of attempts: 1 Airway Equipment and Method: Stylet and Fiberoptic brochoscope Placement Confirmation: ETT inserted through vocal cords under direct vision,  breath sounds checked- equal and bilateral and positive ETCO2 Secured at: 29 cm Tube secured with: Tape Dental Injury: Teeth and Oropharynx as per pre-operative assessment

## 2014-07-09 NOTE — Op Note (Signed)
CARDIOTHORACIC SURGERY OPERATIVE NOTE:  Andre CaveJoseph Dorsey [id number] 07/09/2014   Preoperative Dx:  Recurrent spontaneous right pneumothorax  Postoperative Dx: same   Procedure: Right video-assisted thoracoscopy with staple wedge resection of right upper lobe bullae  Surgeon: Dr. Alleen BorneBryan K. Bartle   Assistant: Doree Fudgeonielle Zimmerman, PA-C  Anesthesia: GET   Clinical History:   The patient is a 34 year old smoker who had a spontaneous right ptx treated by Dr. Lovell SheehanJenkins at [facility] on 3/1/22016 with a right chest tube. The ptx resolved and the tube was removed after a few days. He has continued to smoke and now presented again to [facility] last night with a second spontaneous right ptx. He had a right chest tube inserted by the ER physician. 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 Will plan right VATS for bleb staping and pleurodesis tomorrow. I discussed the procedure with him including alternative of continue chest tube drainage, benefits, and risks including but not limited to bleeding, infection, persistent air leak, and recurrent ptx. He understands and agrees to proceed.   Operative procedure:   The patient was seen in the preoperative holding area. The proper patient, proper operative side, proper operation were confirmed after reviewing his history and chest x-ray. The right side of the chest was signed by me. Preoperative intravenous antibiotics were given. He was taken back to the operating room and placed on table in the supine position. After induction of general endotracheal anesthesia using a double-lumen tube, a Foley catheter was placed in the bladder using sterile technique. Lower extremity sequential compression devices were used. The patient was positioned in the left lateral decubitus position with the right side up. The previously placed chest tube was removed. The rightt side of the chest was prepped with Betadine soap and solution and draped in the usual  sterile manner. A timeout was taken and the proper patient, proper operation, and proper operative side were confirmed with nursing and anesthesia staff. A 1 cm incision was made in the midaxillary line at about the eighth intercostal space. The right pleural space was entered bluntly with a hemostat and an 8 mm trocar was inserted. The 30 thoracoscope was inserted and the pleural space inspected. A second 1 cm incision was made in the anterior axillary line at the 4th ICS and a third 1 cm incision in the posterior axillary line at the 5th ICS. The apex was examined and there were large blebs present and these extended down the medial aspect of the upper lobe along the mediastinum. The apical blebs were resected using staplers and the wedge specimens x 2 were sent to pathology. The superior segment of the lower lobe had no blebs. The remainder of the lung was carefully examined and no further blebs were seen. Therefore a complete mechanical pleurodesis was performed using a piece of an abrasive pad placed in a long clamp. Then one 28 French chest tube was placed. The lung was reinflated under direct vision and I did not see any significant air leak. Hemostasis appeared adequate. The anterior and posterior axillary incisions were then closed in layers using a 2-0 Vicryl subcutaneous suture and 3-0 Vicryl subcuticular skin closure. The chest tubes were connected to Pleur-evac suction. The sponge needle and instrument counts were correct according to the scrub nurse. The patient was then turned into the supine position, extubated, and transported to the post anesthesia care unit in satisfactory and stable condition.

## 2014-07-09 NOTE — Progress Notes (Signed)
Patient ID: Andre CaveJoseph Dorsey, male   DOB: March 16, 1980, 34 y.o.   MRN: [id number]  SICU Evening Rounds:  Hemodynamically stable  Chest tube output low. No air leak.

## 2014-07-10 ENCOUNTER — Encounter (HOSPITAL_COMMUNITY): Payer: Self-pay | Admitting: Surgery

## 2014-07-10 ENCOUNTER — Inpatient Hospital Stay (HOSPITAL_COMMUNITY): Payer: Medicaid Other

## 2014-07-10 LAB — BASIC METABOLIC PANEL
Anion gap: 6 (ref 5–15)
BUN: 6 mg/dL (ref 6–20)
CO2: 28 mmol/L (ref 22–32)
Calcium: 9.1 mg/dL (ref 8.9–10.3)
Chloride: 103 mmol/L (ref 101–111)
Creatinine, Ser: 0.87 mg/dL (ref 0.61–1.24)
GFR calc Af Amer: >60 mL/min (ref >60)
Glucose, Bld: 100 mg/dL — ABNORMAL HIGH (ref 65–99)
POTASSIUM: 3.9 mmol/L (ref 3.5–5.1)
Sodium: 137 mmol/L (ref 135–145)

## 2014-07-10 LAB — CBC
HCT: 37.1 % — ABNORMAL LOW (ref 39.0–52.0)
Hemoglobin: 12.8 g/dL — ABNORMAL LOW (ref 13.0–17.0)
MCH: 32.7 pg (ref 26.0–34.0)
MCHC: 34.5 g/dL (ref 30.0–36.0)
MCV: 94.6 fL (ref 78.0–100.0)
Platelets: 224 10*3/uL (ref 150–400)
RBC: 3.92 MIL/uL — AB (ref 4.22–5.81)
RDW: 13.3 % (ref 11.5–15.5)
WBC: 9.5 10*3/uL (ref 4.0–10.5)

## 2014-07-10 LAB — POCT I-STAT 3, ART BLOOD GAS (G3+)
ACID-BASE EXCESS: 1 mmol/L (ref 0.0–2.0)
Bicarbonate: 26 mEq/L — ABNORMAL HIGH (ref 20.0–24.0)
O2 Saturation: 95 %
Patient temperature: 37.1
TCO2: 27 mmol/L (ref 0–100)
pCO2 arterial: 42.3 mmHg (ref 35.0–45.0)
pH, Arterial: 7.397 (ref 7.350–7.450)
pO2, Arterial: 79 mmHg — ABNORMAL LOW (ref 80.0–100.0)

## 2014-07-10 LAB — GLUCOSE, CAPILLARY
Glucose-Capillary: 102 mg/dL — ABNORMAL HIGH (ref 65–99)
Glucose-Capillary: 109 mg/dL — ABNORMAL HIGH (ref 65–99)

## 2014-07-10 MED ORDER — LEVALBUTEROL HCL 0.63 MG/3ML IN NEBU
0.6300 mg | INHALATION_SOLUTION | Freq: Two times a day (BID) | RESPIRATORY_TRACT | Status: DC
  Administered 2014-07-11 – 2014-07-12 (×3): 0.63 mg via RESPIRATORY_TRACT
  Filled 2014-07-10 (×6): qty 3

## 2014-07-10 NOTE — Anesthesia Postprocedure Evaluation (Signed)
  Anesthesia Post-op Note  Patient: [name]  Procedure(s) Performed: Procedure(s): VIDEO ASSISTED THORACOSCOPY (Right) STAPLING OF BLEBS (Right)  Patient Location: PACU  Anesthesia Type:General  Level of Consciousness: awake and alert   Airway and Oxygen Therapy: Patient Spontanous Breathing  Post-op Pain: mild  Post-op Assessment: Post-op Vital signs reviewed  Post-op Vital Signs: stable  Last Vitals:  Filed Vitals:   07/10/14 0725  BP:   Pulse:   Temp: 36.9 C  Resp:     Complications: No apparent anesthesia complications

## 2014-07-10 NOTE — Progress Notes (Signed)
Pt ambulated 350 ft in hallways during shift; denies any distress, sob or discomfort; pt stable during shift; 60ml output for chest tube; reported off to incoming RN. Arabella MerlesP. Amo Bowyn Mercier RN.

## 2014-07-10 NOTE — Progress Notes (Signed)
1 Day Post-Op Procedure(s) (LRB): VIDEO ASSISTED THORACOSCOPY (Right) STAPLING OF BLEBS (Right) Subjective: No complaints. Wants to get out of the ICU  Objective: Vital signs in last 24 hours: Temp:  [97.2 F (36.2 C)-99 F (37.2 C)] 98.4 F (36.9 C) (05/18 0725) Pulse Rate:  [59-125] 74 (05/18 0600) Cardiac Rhythm:  [-] Sinus tachycardia (05/17 2000) Resp:  [11-37] 17 (05/18 0400) BP: (90-151)/(54-85) 101/59 mmHg (05/18 0600) SpO2:  [93 %-98 %] 94 % (05/18 0600) Arterial Line BP: (92-159)/(49-81) 107/49 mmHg (05/18 0600)  Hemodynamic parameters for last 24 hours:    Intake/Output from previous day: 05/17 0701 - 05/18 0700 In: 3940 [P.O.:30; I.V.:3860; IV Piggyback:50] Out: 3500 [Urine:3060; Blood:150; Chest Tube:290] Intake/Output this shift:    General appearance: alert and cooperative Heart: regular rate and rhythm, S1, S2 normal, no murmur, click, rub or gallop Lungs: clear to auscultation bilaterally no air leak from chest tube  Lab Results:  Recent Labs  07/07/14 2052 07/10/14 0508  WBC 15.5* 9.5  HGB 15.1 12.8*  HCT 43.8 37.1*  PLT 260 224   BMET:  Recent Labs  07/07/14 2052 07/10/14 0508  NA 137 137  K 3.4* 3.9  CL 103 103  CO2 26 28  GLUCOSE 100* 100*  BUN 15 6  CREATININE 0.80 0.87  CALCIUM 9.7 9.1    PT/INR:  Recent Labs  07/08/14 1500  LABPROT 14.4  INR 1.10   ABG    Component Value Date/Time   PHART 7.397 07/10/2014 0411   HCO3 26.0* 07/10/2014 0411   TCO2 27 07/10/2014 0411   O2SAT 95.0 07/10/2014 0411   CBG (last 3)   Recent Labs  07/09/14 2325 07/10/14 0358 07/10/14 0723  GLUCAP 108* 102* 109*    Assessment/Plan: S/P Procedure(s) (LRB): VIDEO ASSISTED THORACOSCOPY (Right) STAPLING OF BLEBS (Right)  Doing well CXR not done yet this am. Chest tube to water seal. Should be able to come out tomorrow. Mobilize Transfer to 2W   LOS: 3 days    Alleen BorneBryan K Bartle 07/10/2014

## 2014-07-10 NOTE — Progress Notes (Signed)
Utilization review completed.  

## 2014-07-10 NOTE — Progress Notes (Signed)
Patient attempted ambulating this night but complained of moderate pain when he had ambulated about 10 ft. Accompanied him to his room and encouraged him to use his PCA. Will keep monitoring.

## 2014-07-11 ENCOUNTER — Inpatient Hospital Stay (HOSPITAL_COMMUNITY): Payer: Medicaid Other

## 2014-07-11 NOTE — Discharge Summary (Signed)
[address]       [place] [id number]             [phone]              Discharge Summary  Name: Andre CaveJoseph Dorsey DOB: 06/28/1980 33 y.o. MRN: [id number]   Admission Date: 07/07/2014 Discharge Date: 07/12/2014    Admitting Diagnosis: Right spontaneous pneumothorax   Discharge Diagnosis:  Right spontaneous pneumothorax Patient Active Problem List   Diagnosis Date Noted  . Tobacco abuse 04/23/2014  . Pneumothorax 04/22/2014  . Spontaneous pneumothorax 04/22/2014     Procedures: RIGHT VIDEO ASSISTED THORACOSCOPY  STAPLING OF RIGHT UPPER LOBE BLEB - 07/09/2014   HPI:  The patient is a 34 y.o. male smoker who had a spontaneous right pneumothorax treated by Dr. Lovell SheehanJenkins at [facility] on 3/1/22016 with a right chest tube. The pneumothorax resolved and the tube was removed after a few days. He has continued to smoke and now presented [facility] on the evening prior to admission with a second spontaneous right pneumothorax. He had a right chest tube inserted by the ER physician and Dr. Laneta SimmersBartle was called to transfer the [facility] to [facility] management.    Hospital Course:  The patient was [facility] on 07[facility]07/2014.  Because this was his second spontaneous pneumothorax in 2 months, it was felt that he would require a VATS for bleb stapling pleurodesis to decrease the risk of recurrence. All risks, benefits and alternatives of surgery were explained in detail, and the patient agreed to proceed. The patient was taken to the operating room and underwent the above procedure.    The postoperative course has been uneventful.  His chest tube has had no air leak, and chest x-ray has remained stable.  Chest tube was removed on postop day 2. He has been ambulating in the halls without difficulty and is tolerating a regular diet.  Incisions are healing well.  The patient is overall progressing well and is medically stable on for  discharge home on today's date.     Recent vital signs:  Filed Vitals:   07/12/14 0520  BP:   Pulse:   Temp:   Resp: 19    Recent laboratory studies:  CBC:No results for input(s): WBC, HGB, HCT, PLT in the last 72 hours. BMET: No results for input(s): NA, K, CL, CO2, GLUCOSE, BUN, CREATININE, CALCIUM in the last 72 hours.  PT/INR: No results for input(s): LABPROT, INR in the last 72 hours.   Discharge Medications:     Medication List    STOP taking these medications        oxyCODONE-acetaminophen 5-325 MG per tablet  Commonly known as:  PERCOCET/ROXICET      TAKE these medications        ibuprofen 200 MG tablet  Commonly known as:  ADVIL,MOTRIN  Take 200 mg by mouth every 6 (six) hours as needed for mild pain or moderate pain.     oxyCODONE 5 MG immediate release tablet  Commonly known as:  Oxy IR/ROXICODONE  Take 1-2 tablets (5-10 mg total) by mouth every 4 (four) hours as needed for severe pain.         Discharge Instructions:  The patient is to refrain from driving, heavy lifting or strenuous activity.  May shower daily and clean incisions with soap and water.  May resume regular diet.   Follow Up:  Follow-up Information  Follow up with Alleen BorneBryan K Bartle, MD On 07/31/2014.   Specialty:  Cardiothoracic Surgery   Why:  PA/LAT CXR to be taken ([facility] which is in the same building as Dr. Sharee PimpleBartle's office) on 08/01/2014 at 8:30 am;Appointment time is at 9:30 am   Contact information:   [address] [place] [address] [phone]       Follow up with Nurse On 07/25/2014.   Why:  Appointment is with nurse only to have chest tube sutures removed. Appointment time is at 10:00 am   Contact information:   [address] [place] [address] [phone]        Adella HareCOLLINS,[name] 07/15/2014, 3:05 PM

## 2014-07-11 NOTE — Progress Notes (Signed)
07/11/2014 2:55 PM 12.661ml fentanyl wasted in sharps. Witnessed by Loura PardonPriscilla Kuffour RN.  Baden Betsch, Blanchard KelchStephanie Ingold

## 2014-07-11 NOTE — Progress Notes (Signed)
07/11/2014 12:13 PM Chest tube d/c per order and per protocol. Pt. Tolerated well. Occlusive dressing applied.  Blaize Nipper, Blanchard KelchStephanie Ingold

## 2014-07-11 NOTE — Progress Notes (Addendum)
      [address]       [place] [id number]             [phone]       2 Days Post-Op Procedure(s) (LRB): VIDEO ASSISTED THORACOSCOPY (Right) STAPLING OF BLEBS (Right)  Subjective: Patient hopes chest tube is removed today.  Objective: Vital signs in last 24 hours: Temp:  [97.9 F (36.6 C)-98 F (36.7 C)] 97.9 F (36.6 C) (05/19 0455) Pulse Rate:  [66-89] 66 (05/19 0455) Cardiac Rhythm:  [-] Normal sinus rhythm (05/19 0000) Resp:  [12-20] 18 (05/19 0512) BP: (78-120)/(64-77) 116/70 mmHg (05/19 0455) SpO2:  [93 %-99 %] 97 % (05/19 0512) FiO2 (%):  [65 %] 65 % (05/18 2020)     Intake/Output from previous day: 05/18 0701 - 05/19 0700 In: 560 [P.O.:440; I.V.:120] Out: 1600 [Urine:1450; Chest Tube:150]   Physical Exam:  Cardiovascular: RRR Pulmonary: Mostly clear; no rales, wheezes, or rhonchi. Abdomen: Soft, non tender, bowel sounds present. Extremities: Trace lower extremity edema. Wounds: Clean and dry.  No erythema or signs of infection. Chest Tube: water seal,no air leak  Lab Results: CBC: Recent Labs  07/10/14 0508  WBC 9.5  HGB 12.8*  HCT 37.1*  PLT 224   BMET:  Recent Labs  07/10/14 0508  NA 137  K 3.9  CL 103  CO2 28  GLUCOSE 100*  BUN 6  CREATININE 0.87  CALCIUM 9.1    PT/INR:  Recent Labs  07/08/14 1500  LABPROT 14.4  INR 1.10   ABG:  INR: Will add last result for INR, ABG once components are confirmed Will add last 4 CBG results once components are confirmed  Assessment/Plan:  1. CV - SR in the 60's.  2.  Pulmonary - On 2 liters of oxygen via [place]. Wean as tolerates. Chest tube is to water seal and with 150 cc (100 cc last 12 hours) of output last 24 hours. There is no air leak. CXR shows trace right apical pneumothorax, stable right chest subcutaneous emphysema, and bibasilar atelectasis. Will remove chest tube. Encourage incentive spirometer. 3. H and H yesterday stable at 12.8 and 37.1 4. Will stop PCA  once chest tube removed 5. Possibly home in am   ZIMMERMAN,DONIELLE MPA-C 07/11/2014,7:34 AM  Chart reviewed, patient examined, agree with above. No air leak seen since surgery so can remove the chest tube.

## 2014-07-11 NOTE — Discharge Instructions (Signed)
Activity: 1.May walk up steps °               2.No lifting more than ten pounds for four weeks.  °               3.No driving for four weeks. °               4.Stop any activity that causes chest pain, shortness of breath, dizziness,   sweating or excessive weakness. °               5.Avoid straining. °               6.Continue with your breathing exercises daily. ° °Diet: Regular diet ° °Wound Care: May shower.  Clean wounds with mild soap and water daily. Contact the office at [phone] if any problems arise. ° °Thoracoscopy °Care After °Refer to this sheet in the next few weeks. These discharge instructions provide you with general information on caring for yourself after you leave the hospital. Your caregiver may also give you specific instructions. Your treatment has been planned according to the most current medical practices available, but unavoidable complications sometimes occur. If you have any problems or questions after discharge, call your caregiver. °HOME CARE INSTRUCTIONS  °· Remove the bandage (dressing) over your chest tube site as directed by your caregiver. °· It is normal to be sore for a couple weeks following surgery. See your caregiver if this seems to be getting worse rather than better. °· Only take over-the-counter or prescription medicines for pain, discomfort, or fever as directed by your caregiver. It is very important to take pain medicine when you need it so that you will cough and breathe deeply enough to clear mucus (phlegm) and expand your lungs. °· If it hurts to cough, hold a pillow against your chest when you cough. This may help with the discomfort. In spite of the discomfort, cough frequently, as this helps protect against getting an infection in your lung (pneumonia). °· Taking deep breaths keeps lungs inflated and protects against pneumonia. Most patients will go home with an incentive spirometer that encourages deep breathing. °· You may resume a normal diet and  activities as directed. °· Use showers for bathing until you see your caregiver, or as instructed. °· Change dressings if necessary or as directed. °· Avoid lifting or driving until you are instructed otherwise. °· Make an appointment to see your caregiver for stitch (suture) or staple removal when instructed. °· Do not travel by airplane for 2 weeks after the chest tube is removed. °SEEK MEDICAL CARE IF:  °· You are bleeding from your wounds. °· You have redness, swelling, or increasing pain in the wounds. °· Your heartbeat feels irregular or very fast. °· There is pus coming from your wounds. °· There is a bad smell coming from the wound or dressing. °SEEK IMMEDIATE MEDICAL CARE IF:  °· You have a fever. °· You develop a rash. °· You have difficulty breathing. °· You develop any reaction or side effects to medicines given. °· You develop lightheadedness or feel faint. °· You develop shortness of breath or chest pain. °MAKE SURE YOU:  °· Understand these instructions. °· Will watch your condition. °· Will get help right away if you are not doing well or get worse. °Document Released: 08/28/2004 Document Revised: 05/03/2011 Document Reviewed: 07/29/2010 °ExitCare® Patient Information ©2015 [employer]. This information is not intended to replace advice given to you by your health   care provider. Make sure you discuss any questions you have with your health care provider. ° ° ° °

## 2014-07-12 ENCOUNTER — Inpatient Hospital Stay (HOSPITAL_COMMUNITY): Payer: Medicaid Other

## 2014-07-12 MED ORDER — MUPIROCIN 2 % EX OINT: 1.0000 "application " | TOPICAL_OINTMENT | Freq: Two times a day (BID) | CUTANEOUS | Status: AC

## 2014-07-12 MED ORDER — OXYCODONE HCL 5 MG PO TABS: 5.0000 mg | ORAL_TABLET | ORAL | Status: DC | PRN

## 2014-07-12 NOTE — Progress Notes (Addendum)
      [address]       [place] [id number]             [phone]       3 Days Post-Op Procedure(s) (LRB): VIDEO ASSISTED THORACOSCOPY (Right) STAPLING OF BLEBS (Right)  Subjective: Patient with sore throat.  Objective: Vital signs in last 24 hours: Temp:  [97.8 F (36.6 C)-98.3 F (36.8 C)] 97.8 F (36.6 C) (05/20 0420) Pulse Rate:  [66-92] 66 (05/20 0420) Cardiac Rhythm:  [-] Normal sinus rhythm (05/19 0820) Resp:  [16-19] 19 (05/20 0520) BP: (112-122)/(73-79) 122/73 mmHg (05/20 0420) SpO2:  [96 %-98 %] 97 % (05/20 0420)     Intake/Output from previous day: 05/19 0701 - 05/20 0700 In: 720 [P.O.:720] Out: -    Physical Exam:  Cardiovascular: RRR Pulmonary: Mostly clear; no rales, wheezes, or rhonchi. Abdomen: Soft, non tender, bowel sounds present. Extremities: Trace lower extremity edema. Wounds: Clean and dry.  No erythema or signs of infection.   Lab Results: CBC:  Recent Labs  07/10/14 0508  WBC 9.5  HGB 12.8*  HCT 37.1*  PLT 224   BMET:   Recent Labs  07/10/14 0508  NA 137  K 3.9  CL 103  CO2 28  GLUCOSE 100*  BUN 6  CREATININE 0.87  CALCIUM 9.1    PT/INR: No results for input(s): LABPROT, INR in the last 72 hours. ABG:  INR: Will add last result for INR, ABG once components are confirmed Will add last 4 CBG results once components are confirmed  Assessment/Plan:  1. CV - SR in the 60's.  2.  Pulmonary - On room air. Chest tube removed yesterday. CXR shows stable,trace right apical pneumothorax, improvement in right chest subcutaneous emphysema, and bibasilar atelectasis. Encourage incentive spirometer. 3. Throat pain secondary to ET tube. No signs of thrush 4. Discharge home   ZIMMERMAN,DONIELLE MPA-C 07/12/2014,7:50 AM  Chart reviewed, patient examined, agree with above. CXR ok. Plan home today

## 2014-07-25 ENCOUNTER — Ambulatory Visit (INDEPENDENT_AMBULATORY_CARE_PROVIDER_SITE_OTHER): Payer: Self-pay

## 2014-07-25 DIAGNOSIS — Z4802 Encounter for removal of sutures: Secondary | ICD-10-CM

## 2014-07-25 DIAGNOSIS — J939 Pneumothorax, unspecified: Secondary | ICD-10-CM

## 2014-07-25 NOTE — Progress Notes (Signed)
Removed 5 sutures from chest tube incision sites with no signs of infection and patient tolerated well.

## 2014-07-31 ENCOUNTER — Ambulatory Visit: Payer: Medicaid Other | Admitting: Surgery

## 2014-07-31 ENCOUNTER — Other Ambulatory Visit: Payer: Self-pay | Admitting: Surgery

## 2014-07-31 DIAGNOSIS — J939 Pneumothorax, unspecified: Secondary | ICD-10-CM

## 2014-08-02 ENCOUNTER — Ambulatory Visit
Admission: RE | Admit: 2014-08-02 | Discharge: 2014-08-02 | Disposition: A | Discharge status: Home / Self Care | Payer: Medicaid Other | Source: Ambulatory Visit | Attending: Surgery | Admitting: Surgery

## 2014-08-02 ENCOUNTER — Ambulatory Visit (INDEPENDENT_AMBULATORY_CARE_PROVIDER_SITE_OTHER): Payer: Self-pay | Admitting: Surgery

## 2014-08-02 ENCOUNTER — Encounter: Payer: Self-pay | Admitting: Surgery

## 2014-08-02 VITALS — BP 130/77 | HR 84 | Resp 20 | Ht 72.0 in | Wt 143.0 lb

## 2014-08-02 DIAGNOSIS — J9383 Other pneumothorax: Secondary | ICD-10-CM

## 2014-08-02 DIAGNOSIS — J939 Pneumothorax, unspecified: Secondary | ICD-10-CM
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
## 2014-08-08 ENCOUNTER — Encounter: Payer: Self-pay | Admitting: *Deleted

## 2014-08-09 ENCOUNTER — Encounter: Payer: Self-pay | Admitting: *Deleted

## 2015-10-21 IMAGING — CR DG CHEST 2V
2 series · 2 of 2 positions shown · non-contrast
Comparison: 07/12/2014, 07/07/2014

CLINICAL DATA: Following up of pneumothorax from 07/07/2014,
patient complains of right-sided discomfort today.

EXAM:
CHEST  2 VIEW

[w chest pa]
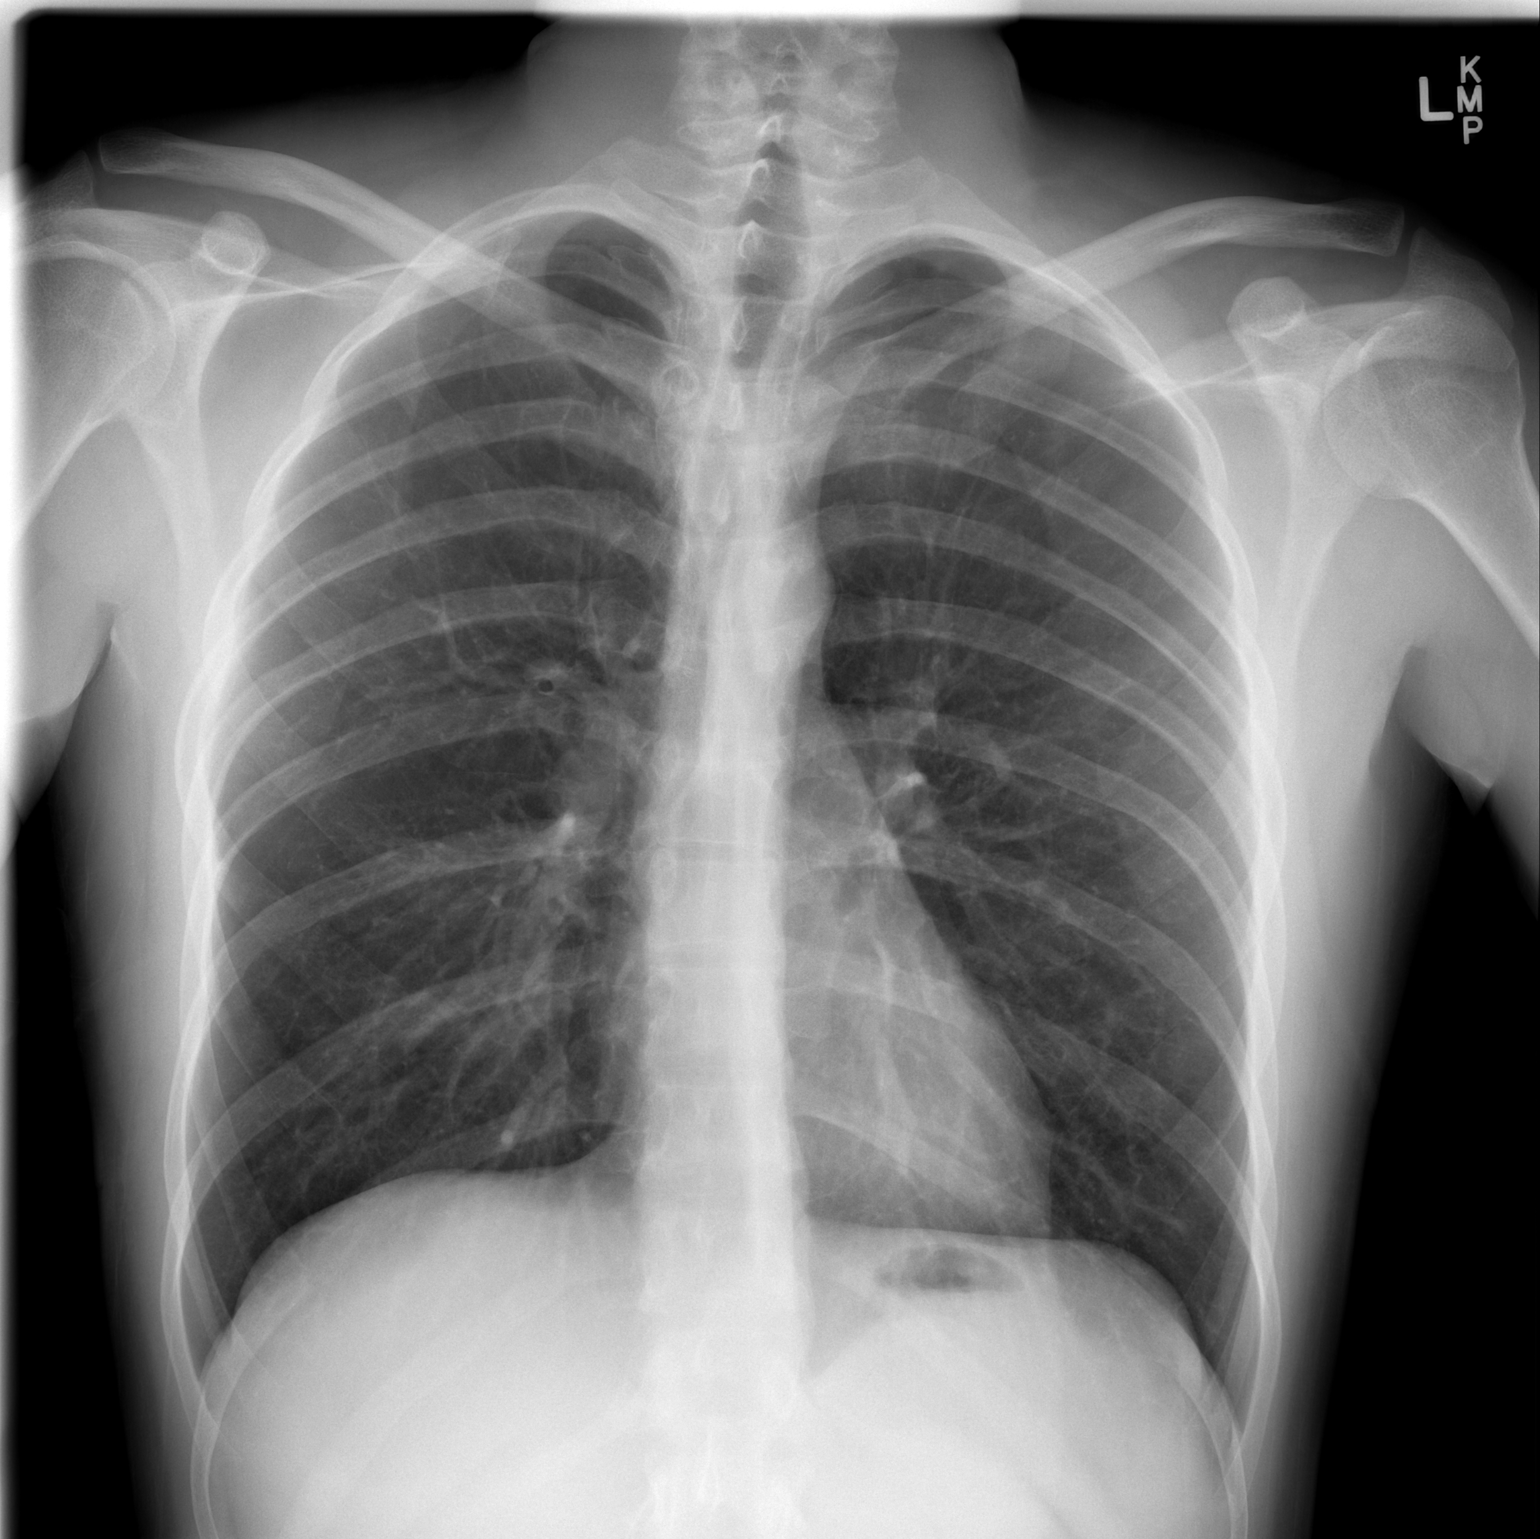

[w chest lat]
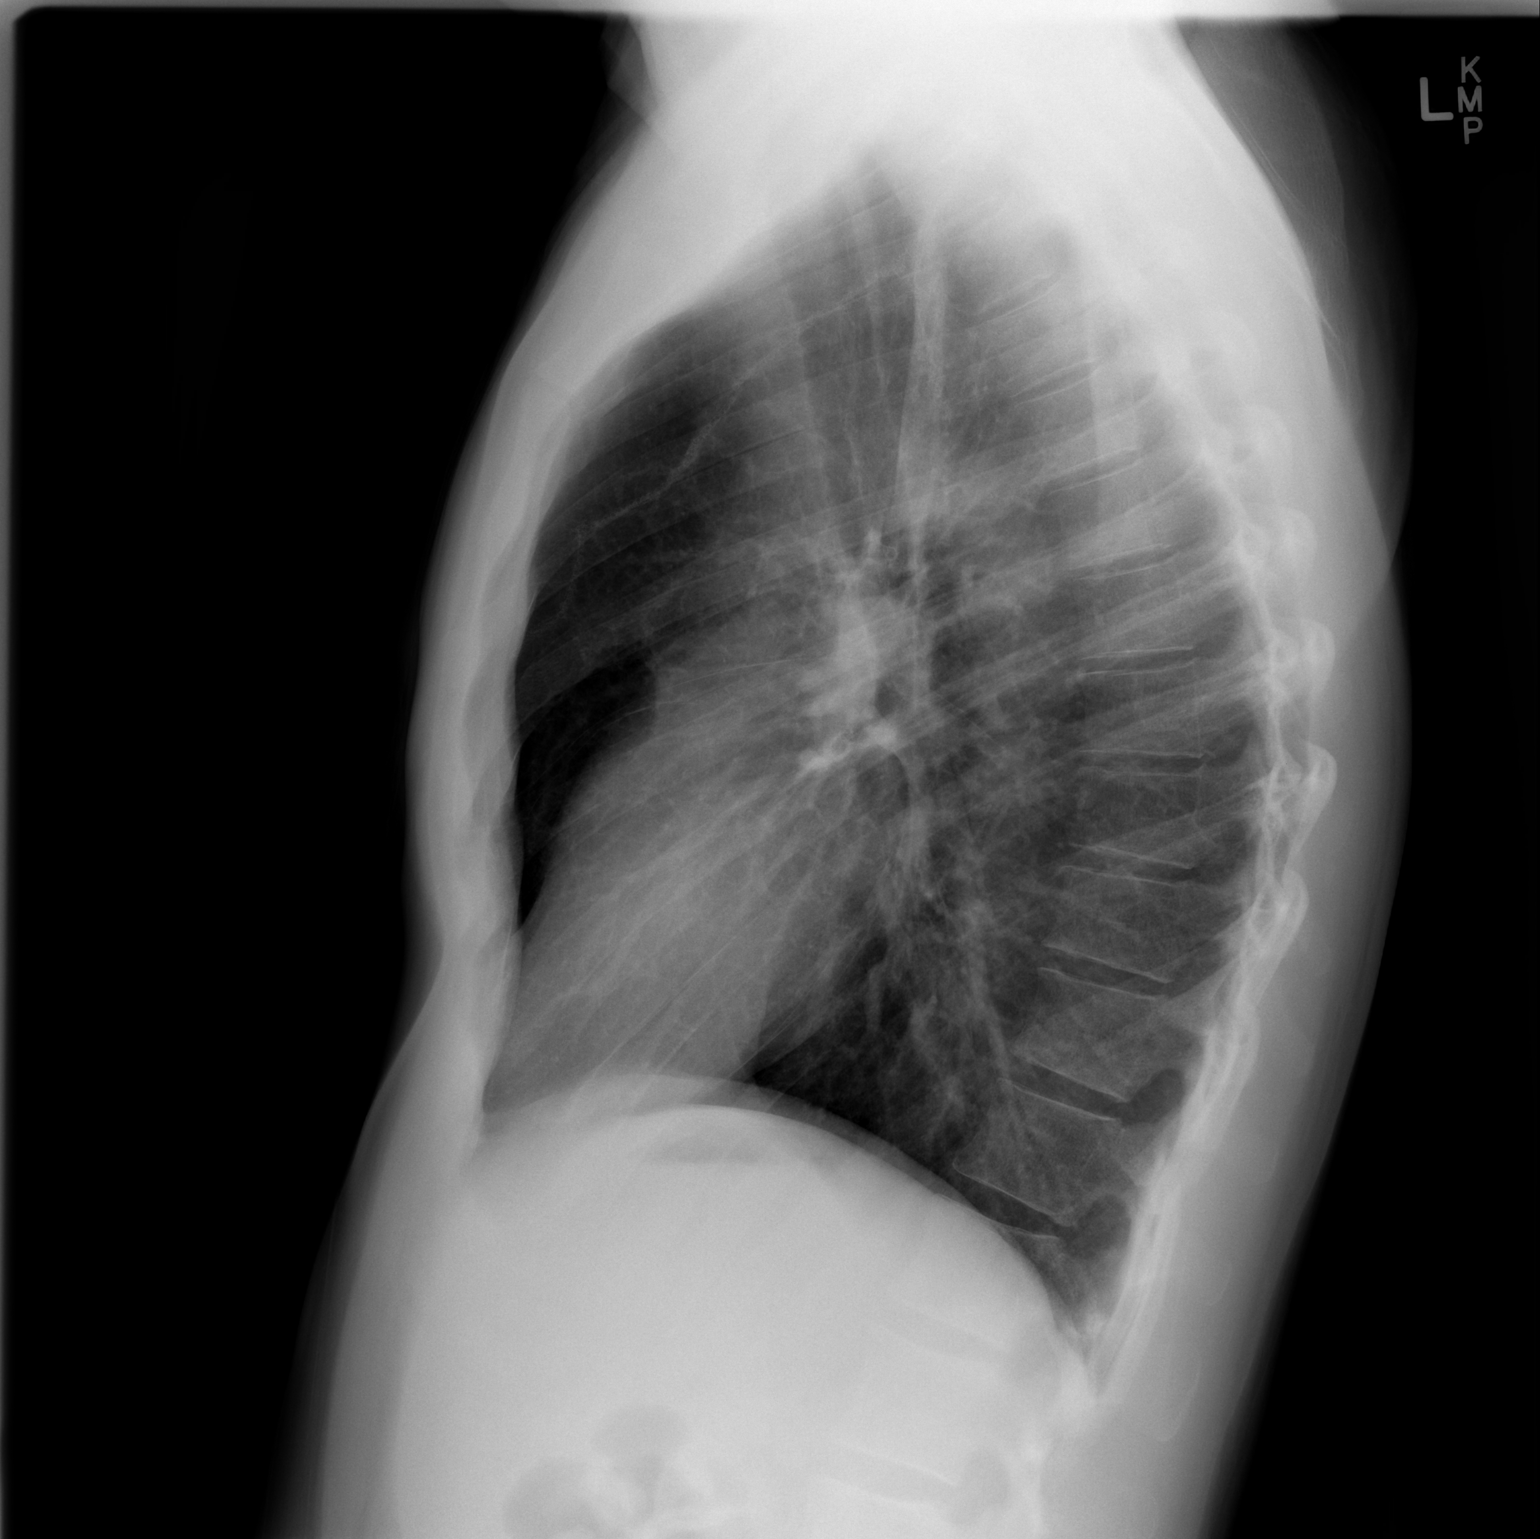

[2 of 2 positions shown; findings below may reference images not displayed]

FINDINGS: The heart size and mediastinal contours are within normal limits.
Both lungs are clear. There is no evidence of focal airspace
consolidation, pleural effusion or pneumothorax. Postsurgical
changes in the right hemithorax are noted. Stable upper lobe
predominant emphysematous changes. The visualized skeletal
structures are unremarkable.
IMPRESSION: No active cardiopulmonary disease.

No radiographically apparent residual right pneumothorax.

## 2020-12-04 ENCOUNTER — Emergency Department (HOSPITAL_COMMUNITY)
Admission: EM | Admit: 2020-12-04 | Discharge: 2020-12-04 | Disposition: A | Discharge status: Home / Self Care | Payer: Medicaid Other | Attending: Emergency Medicine | Admitting: Emergency Medicine

## 2020-12-04 ENCOUNTER — Encounter (HOSPITAL_COMMUNITY): Payer: Self-pay | Admitting: *Deleted

## 2020-12-04 ENCOUNTER — Emergency Department (HOSPITAL_COMMUNITY): Payer: Medicaid Other

## 2020-12-04 ENCOUNTER — Other Ambulatory Visit: Payer: Self-pay

## 2020-12-04 DIAGNOSIS — Z79899 Other long term (current) drug therapy: Secondary | ICD-10-CM | POA: Insufficient documentation

## 2020-12-04 DIAGNOSIS — S29012A Strain of muscle and tendon of back wall of thorax, initial encounter: Secondary | ICD-10-CM | POA: Insufficient documentation

## 2020-12-04 DIAGNOSIS — F17210 Nicotine dependence, cigarettes, uncomplicated: Secondary | ICD-10-CM | POA: Insufficient documentation

## 2020-12-04 DIAGNOSIS — R911 Solitary pulmonary nodule: Secondary | ICD-10-CM | POA: Insufficient documentation

## 2020-12-04 DIAGNOSIS — S39012A Strain of muscle, fascia and tendon of lower back, initial encounter: Secondary | ICD-10-CM | POA: Diagnosis not present

## 2020-12-04 DIAGNOSIS — S34109A Unspecified injury to unspecified level of lumbar spinal cord, initial encounter: Secondary | ICD-10-CM | POA: Diagnosis present

## 2020-12-04 DIAGNOSIS — S161XXA Strain of muscle, fascia and tendon at neck level, initial encounter: Secondary | ICD-10-CM | POA: Insufficient documentation

## 2020-12-04 DIAGNOSIS — Y900 Blood alcohol level of less than 20 mg/100 ml: Secondary | ICD-10-CM | POA: Diagnosis not present

## 2020-12-04 DIAGNOSIS — J449 Chronic obstructive pulmonary disease, unspecified: Secondary | ICD-10-CM | POA: Insufficient documentation

## 2020-12-04 DIAGNOSIS — S29019A Strain of muscle and tendon of unspecified wall of thorax, initial encounter: Secondary | ICD-10-CM

## 2020-12-04 DIAGNOSIS — Z720 Tobacco use: Secondary | ICD-10-CM

## 2020-12-04 DIAGNOSIS — Y92410 Unspecified street and highway as the place of occurrence of the external cause: Secondary | ICD-10-CM | POA: Diagnosis not present

## 2020-12-04 DIAGNOSIS — T1490XA Injury, unspecified, initial encounter: Secondary | ICD-10-CM

## 2020-12-04 LAB — COMPREHENSIVE METABOLIC PANEL
ALT: 19 U/L (ref 0–44)
AST: 26 U/L (ref 15–41)
Albumin: 4.2 g/dL (ref 3.5–5.0)
Alkaline Phosphatase: 64 U/L (ref 38–126)
Anion gap: 6 (ref 5–15)
BUN: 19 mg/dL (ref 6–20)
CO2: 24 mmol/L (ref 22–32)
Calcium: 9 mg/dL (ref 8.9–10.3)
Chloride: 105 mmol/L (ref 98–111)
Creatinine, Ser: 0.92 mg/dL (ref 0.61–1.24)
GFR, Estimated: >60 mL/min (ref >60)
Glucose, Bld: 93 mg/dL (ref 70–99)
Potassium: 3.5 mmol/L (ref 3.5–5.1)
Sodium: 135 mmol/L (ref 135–145)
Total Bilirubin: 0.7 mg/dL (ref 0.3–1.2)
Total Protein: 7.6 g/dL (ref 6.5–8.1)

## 2020-12-04 LAB — CBC
HCT: 41.4 % (ref 39.0–52.0)
Hemoglobin: 14.4 g/dL (ref 13.0–17.0)
MCH: 33.3 pg (ref 26.0–34.0)
MCHC: 34.8 g/dL (ref 30.0–36.0)
MCV: 95.8 fL (ref 80.0–100.0)
Platelets: 267 10*3/uL (ref 150–400)
RBC: 4.32 MIL/uL (ref 4.22–5.81)
RDW: 13.2 % (ref 11.5–15.5)
WBC: 9.4 10*3/uL (ref 4.0–10.5)
nRBC: 0 % (ref 0.0–0.2)

## 2020-12-04 LAB — LACTIC ACID, PLASMA: Lactic Acid, Venous: 0.7 mmol/L (ref 0.5–1.9)

## 2020-12-04 LAB — ETHANOL: Alcohol, Ethyl (B): <10 mg/dL (ref <10)

## 2020-12-04 MED ORDER — ONDANSETRON HCL 4 MG/2ML IJ SOLN
4.0000 mg | Freq: Once | INTRAMUSCULAR | Status: AC
  Administered 2020-12-04: 4 mg via INTRAVENOUS
  Filled 2020-12-04: qty 2

## 2020-12-04 MED ORDER — SODIUM CHLORIDE 0.9 % IV SOLN: INTRAVENOUS | Status: DC

## 2020-12-04 MED ORDER — SODIUM CHLORIDE 0.9 % IV BOLUS
1000.0000 mL | Freq: Once | INTRAVENOUS | Status: AC
  Administered 2020-12-04: 1000 mL via INTRAVENOUS

## 2020-12-04 MED ORDER — MORPHINE SULFATE (PF) 4 MG/ML IV SOLN
4.0000 mg | Freq: Once | INTRAVENOUS | Status: AC
Start: 2020-12-04 — End: 2020-12-04
  Administered 2020-12-04: 4 mg via INTRAVENOUS
  Filled 2020-12-04: qty 1

## 2020-12-04 MED ORDER — IOHEXOL 300 MG/ML  SOLN
100.0000 mL | Freq: Once | INTRAMUSCULAR | Status: AC | PRN
  Administered 2020-12-04: 100 mL via INTRAVENOUS

## 2020-12-04 MED ORDER — DIAZEPAM 5 MG PO TABS: 5.0000 mg | ORAL_TABLET | Freq: Two times a day (BID) | ORAL | 0 refills | Status: DC

## 2020-12-04 MED ORDER — IBUPROFEN 800 MG PO TABS: 800.0000 mg | ORAL_TABLET | Freq: Three times a day (TID) | ORAL | 0 refills | Status: DC

## 2020-12-04 MED ORDER — HYDROCODONE-ACETAMINOPHEN 5-325 MG PO TABS: 1.0000 | ORAL_TABLET | ORAL | 0 refills | Status: DC | PRN

## 2020-12-04 NOTE — Discharge Instructions (Addendum)
It is very important that you try to quit smoking. You have already developed emphysema.  You also have a lung nodule which will need a repeat CT scan of your chest in about 1 year to make sure it stays the same size.
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
## 2022-06-25 ENCOUNTER — Observation Stay (HOSPITAL_COMMUNITY)
Admission: EM | Admit: 2022-06-25 | Discharge: 2022-06-26 | Disposition: A | Discharge status: Home / Self Care | Payer: 59 | Attending: Internal Medicine | Admitting: Internal Medicine

## 2022-06-25 ENCOUNTER — Encounter (HOSPITAL_COMMUNITY): Payer: Self-pay

## 2022-06-25 ENCOUNTER — Other Ambulatory Visit: Payer: Self-pay

## 2022-06-25 ENCOUNTER — Emergency Department (HOSPITAL_COMMUNITY): Payer: 59

## 2022-06-25 DIAGNOSIS — G5680 Other specified mononeuropathies of unspecified upper limb: Secondary | ICD-10-CM | POA: Insufficient documentation

## 2022-06-25 DIAGNOSIS — F17210 Nicotine dependence, cigarettes, uncomplicated: Secondary | ICD-10-CM | POA: Insufficient documentation

## 2022-06-25 DIAGNOSIS — Z720 Tobacco use: Secondary | ICD-10-CM | POA: Diagnosis present

## 2022-06-25 DIAGNOSIS — G5682 Other specified mononeuropathies of left upper limb: Secondary | ICD-10-CM | POA: Insufficient documentation

## 2022-06-25 DIAGNOSIS — E876 Hypokalemia: Secondary | ICD-10-CM | POA: Insufficient documentation

## 2022-06-25 DIAGNOSIS — E860 Dehydration: Secondary | ICD-10-CM | POA: Insufficient documentation

## 2022-06-25 DIAGNOSIS — R079 Chest pain, unspecified: Secondary | ICD-10-CM

## 2022-06-25 DIAGNOSIS — N179 Acute kidney failure, unspecified: Secondary | ICD-10-CM

## 2022-06-25 DIAGNOSIS — R0789 Other chest pain: Secondary | ICD-10-CM | POA: Diagnosis present

## 2022-06-25 HISTORY — DX: Disorder of kidney and ureter, unspecified: N28.9

## 2022-06-25 LAB — CBC WITH DIFFERENTIAL/PLATELET
Abs Immature Granulocytes: 0.06 10*3/uL (ref 0.00–0.07)
Basophils Absolute: 0.1 10*3/uL (ref 0.0–0.1)
Basophils Relative: 1 %
Eosinophils Absolute: 0.3 10*3/uL (ref 0.0–0.5)
Eosinophils Relative: 2 %
HCT: 49.5 % (ref 39.0–52.0)
Hemoglobin: 17.5 g/dL — ABNORMAL HIGH (ref 13.0–17.0)
Immature Granulocytes: 0 %
Lymphocytes Relative: 31 %
Lymphs Abs: 4.5 10*3/uL — ABNORMAL HIGH (ref 0.7–4.0)
MCH: 33 pg (ref 26.0–34.0)
MCHC: 35.4 g/dL (ref 30.0–36.0)
MCV: 93.4 fL (ref 80.0–100.0)
Monocytes Absolute: 1.3 10*3/uL — ABNORMAL HIGH (ref 0.1–1.0)
Monocytes Relative: 9 %
Neutro Abs: 8.2 10*3/uL — ABNORMAL HIGH (ref 1.7–7.7)
Neutrophils Relative %: 57 %
Platelets: 319 10*3/uL (ref 150–400)
RBC: 5.3 MIL/uL (ref 4.22–5.81)
RDW: 13.2 % (ref 11.5–15.5)
WBC: 14.5 10*3/uL — ABNORMAL HIGH (ref 4.0–10.5)
nRBC: 0 % (ref 0.0–0.2)

## 2022-06-25 LAB — BASIC METABOLIC PANEL
Anion gap: 13 (ref 5–15)
BUN: 29 mg/dL — ABNORMAL HIGH (ref 6–20)
CO2: 20 mmol/L — ABNORMAL LOW (ref 22–32)
Calcium: 10.2 mg/dL (ref 8.9–10.3)
Chloride: 101 mmol/L (ref 98–111)
Creatinine, Ser: 1.81 mg/dL — ABNORMAL HIGH (ref 0.61–1.24)
GFR, Estimated: 48 mL/min — ABNORMAL LOW (ref >60)
Glucose, Bld: 93 mg/dL (ref 70–99)
Potassium: 3.4 mmol/L — ABNORMAL LOW (ref 3.5–5.1)
Sodium: 134 mmol/L — ABNORMAL LOW (ref 135–145)

## 2022-06-25 LAB — TROPONIN I (HIGH SENSITIVITY): Troponin I (High Sensitivity): 53 ng/L — ABNORMAL HIGH (ref <18)

## 2022-06-25 MED ORDER — SODIUM CHLORIDE 0.9 % IV BOLUS
1000.0000 mL | Freq: Once | INTRAVENOUS | Status: AC
  Administered 2022-06-25: 1000 mL via INTRAVENOUS

## 2022-06-25 MED ORDER — NITROGLYCERIN 0.4 MG SL SUBL
0.4000 mg | SUBLINGUAL_TABLET | Freq: Once | SUBLINGUAL | Status: AC
  Administered 2022-06-25: 0.4 mg via SUBLINGUAL
  Filled 2022-06-25: qty 1

## 2022-06-25 NOTE — ED Triage Notes (Signed)
Pt arrived from home via [facility] c/o sharp, tight pain in his left chest. 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 Pt reports taking 324 ASA PTA with minimal relief. Pt endorses some burning/numb sensation originating in his cervical neck region.

## 2022-06-25 NOTE — ED Provider Notes (Signed)
11:47 PM Assumed care from Dr. Estell Harpin, please see their note for full history, physical and decision making until this point. In brief this is a 42 y.o. year old male who presented to the ED tonight with Chest Pain     Here with intermittent chest pain. Normal ECG. Troponin elevated. AKI as well. Likely dehydated. Pending second troponin and cards consult.   Second troponin better. Cards doesn't think patient needs heparin or transfer for cards consult, will d/w hospitalist for admission.     Labs, studies and imaging reviewed by myself and considered in medical decision making if ordered. Imaging interpreted by radiology.  Labs Reviewed  CBC WITH DIFFERENTIAL/PLATELET - Abnormal; Notable for the following components:      Result Value   WBC 14.5 (*)    Hemoglobin 17.5 (*)    Neutro Abs 8.2 (*)    Lymphs Abs 4.5 (*)    Monocytes Absolute 1.3 (*)    All other components within normal limits  BASIC METABOLIC PANEL - Abnormal; Notable for the following components:   Sodium 134 (*)    Potassium 3.4 (*)    CO2 20 (*)    BUN 29 (*)    Creatinine, Ser 1.81 (*)    GFR, Estimated 48 (*)    All other components within normal limits  TROPONIN I (HIGH SENSITIVITY) - Abnormal; Notable for the following components:   Troponin I (High Sensitivity) 53 (*)    All other components within normal limits  TROPONIN I (HIGH SENSITIVITY)    DG Chest 2 View  Final Result      No follow-ups on file.    Dorris Pierre, Barbara Cower, MD 06/26/22 [phone]

## 2022-06-25 NOTE — ED Notes (Signed)
[facility] rep[facility] Cardiology

## 2022-06-25 NOTE — ED Provider Notes (Signed)
[place] EMERGENCY DEPARTMENT AT [facility] Provider Note   CSN: [id number] Arrival date & time: 06/25/22  2102     History {Add pertinent medical, surgical, social history, OB history to HPI:1} Chief Complaint  Patient presents with   Chest Pain    [name] is a 42 y.o. male.  Patient states he had chest tightness lasting about 20 minutes today.  He has been having periodic chest pains that have not lasted this long for the last few weeks.  Patient has a history of smoking  The history is provided by the patient and medical records.  Chest Pain Pain location:  Substernal area Pain quality: aching   Pain radiates to:  Does not radiate Pain severity:  Moderate Onset quality:  Sudden Timing:  Intermittent Progression:  Waxing and waning Chronicity:  New Associated symptoms: no abdominal pain, no back pain, no cough, no fatigue and no headache        Home Medications Prior to Admission medications   Medication Sig Start Date End Date Taking? Authorizing Provider  diazepam (VALIUM) 5 MG tablet Take 1 tablet (5 mg total) by mouth 2 (two) times daily. 12/04/20   Jacalyn Lefevre, MD  HYDROcodone-acetaminophen (NORCO/VICODIN) 5-325 MG tablet Take 1 tablet by mouth every 4 (four) hours as needed. 12/04/20   Jacalyn Lefevre, MD  ibuprofen (ADVIL) 800 MG tablet Take 1 tablet (800 mg total) by mouth 3 (three) times daily. 12/04/20   Jacalyn Lefevre, MD  oxyCODONE (OXY IR/ROXICODONE) 5 MG immediate release tablet Take 1-2 tablets (5-10 mg total) by mouth every 4 (four) hours as needed for severe pain. Patient not taking: Reported on 12/04/2020 07/12/14   Ardelle Balls, PA-C      Allergies    Ceclor [cefaclor]    Review of Systems   Review of Systems  Constitutional:  Negative for appetite change and fatigue.  HENT:  Negative for congestion, ear discharge and sinus pressure.   Eyes:  Negative for discharge.  Respiratory:  Negative for cough.    Cardiovascular:  Positive for chest pain.  Gastrointestinal:  Negative for abdominal pain and diarrhea.  Genitourinary:  Negative for frequency and hematuria.  Musculoskeletal:  Negative for back pain.  Skin:  Negative for rash.  Neurological:  Negative for seizures and headaches.  Psychiatric/Behavioral:  Negative for hallucinations.     Physical Exam Updated Vital Signs BP 106/73   Pulse 72   Temp (!) 97.5 F (36.4 C) (Oral)   Resp 18   Ht 6' (1.829 m)   Wt 65.8 kg   SpO2 99%   BMI 19.67 kg/m  Physical Exam Vitals and nursing note reviewed.  Constitutional:      Appearance: He is well-developed.  HENT:     Head: Normocephalic.     Nose: Nose normal.  Eyes:     General: No scleral icterus.    Conjunctiva/sclera: Conjunctivae normal.  Neck:     Thyroid: No thyromegaly.  Cardiovascular:     Rate and Rhythm: Normal rate and regular rhythm.     Heart sounds: No murmur heard.    No friction rub. No gallop.  Pulmonary:     Breath sounds: No stridor. No wheezing or rales.  Chest:     Chest wall: No tenderness.  Abdominal:     General: There is no distension.     Tenderness: There is no abdominal tenderness. There is no rebound.  Musculoskeletal:        General: Normal range  of motion.     Cervical back: Neck supple.  Lymphadenopathy:     Cervical: No cervical adenopathy.  Skin:    Findings: No erythema or rash.  Neurological:     Mental Status: He is alert and oriented to person, place, and time.     Motor: No abnormal muscle tone.     Coordination: Coordination normal.  Psychiatric:        Behavior: Behavior normal.     ED Results / Procedures / Treatments   Labs (all labs ordered are listed, but only abnormal results are displayed) Labs Reviewed  CBC WITH DIFFERENTIAL/PLATELET - Abnormal; Notable for the following components:      Result Value   WBC 14.5 (*)    Hemoglobin 17.5 (*)    Neutro Abs 8.2 (*)    Lymphs Abs 4.5 (*)    Monocytes Absolute 1.3  (*)    All other components within normal limits  BASIC METABOLIC PANEL - Abnormal; Notable for the following components:   Sodium 134 (*)    Potassium 3.4 (*)    CO2 20 (*)    BUN 29 (*)    Creatinine, Ser 1.81 (*)    GFR, Estimated 48 (*)    All other components within normal limits  TROPONIN I (HIGH SENSITIVITY) - Abnormal; Notable for the following components:   Troponin I (High Sensitivity) 53 (*)    All other components within normal limits  TROPONIN I (HIGH SENSITIVITY)    EKG EKG Interpretation  Date/Time:  Friday Jun 25 2022 22:21:16 EDT Ventricular Rate:  74 PR Interval:  153 QRS Duration: 88 QT Interval:  375 QTC Calculation: 416 R Axis:   86 Text Interpretation: Sinus rhythm ST elev, probable normal early repol pattern Confirmed by Bethann Berkshire [phone]) on 06/25/2022 10:24:24 PM  Radiology DG Chest 2 View  Result Date: 06/25/2022 CLINICAL DATA:  Chest pain EXAM: CHEST - 2 VIEW COMPARISON:  Chest x-ray 08/02/2014 FINDINGS: The heart size and mediastinal contours are within normal limits. Both lungs are clear. The visualized skeletal structures are unremarkable. IMPRESSION: No active cardiopulmonary disease. Electronically Signed   By: Darliss Cheney M.D.   On: 06/25/2022 21:45    Procedures Procedures  {Document cardiac monitor, telemetry assessment procedure when appropriate:1}  Medications Ordered in ED Medications  nitroGLYCERIN (NITROSTAT) SL tablet 0.4 mg (0.4 mg Sublingual Given 06/25/22 2229)  sodium chloride 0.9 % bolus 1,000 mL (1,000 mLs Intravenous New Bag/Given 06/25/22 2328)    ED Course/ Medical Decision Making/ A&P   {  CRITICAL CARE Performed by: Bethann Berkshire Total critical care time: 35 minutes Critical care time was exclusive of separately billable procedures and treating other patients. Critical care was necessary to treat or prevent imminent or life-threatening deterioration. Critical care was time spent personally by me on the following  activities: development of treatment plan with patient and/or surrogate as well as nursing, discussions with consultants, evaluation of patient's response to treatment, examination of patient, obtaining history from patient or surrogate, ordering and performing treatments and interventions, ordering and review of laboratory studies, ordering and review of radiographic studies, pulse oximetry and re-evaluation of patient's condition.  Patient has mild elevation of the first troponin at 53 but no EKG changes.  Presently he is not having any discomfort.  I spoke with cardiology and they would like [place] to get a second troponin and a repeat EKG when the results are back.  Cardiology was needs to be contacted again after this is  done. Click here for ABCD2, HEART and other calculatorsREFRESH Note before signing :1}                          Medical Decision Making Amount and/or Complexity of Data Reviewed Labs: ordered. Radiology: ordered.  Risk Prescription drug management.   Chest pain with elevated troponin  {Document critical care time when appropriate:1} {Document review of labs and clinical decision tools ie heart score, Chads2Vasc2 etc:1}  {Document your independent review of radiology images, and any outside records:1} {Document your discussion with family members, caretakers, and with consultants:1} {Document social determinants of health affecting pt's care:1} {Document your decision making why or why not admission, treatments were needed:1} Final Clinical Impression(s) / ED Diagnoses Final diagnoses:  None    Rx / DC Orders ED Discharge Orders     None

## 2022-06-25 NOTE — ED Notes (Signed)
ED Provider at bedside. 

## 2022-06-25 NOTE — ED Notes (Signed)
Pt pressed call bell- this nurse to room, pt clenching chest and left arm, reports chest pain 10 of 10- EKG performed and given to Dr Estell Harpin- new order received for [facility], primary RN aware

## 2022-06-25 NOTE — ED Notes (Signed)
Pt returned from [facility].

## 2022-06-25 NOTE — ED Notes (Signed)
Patient transported to X-ray 

## 2022-06-26 ENCOUNTER — Observation Stay (HOSPITAL_BASED_OUTPATIENT_CLINIC_OR_DEPARTMENT_OTHER): Payer: 59

## 2022-06-26 DIAGNOSIS — N179 Acute kidney failure, unspecified: Secondary | ICD-10-CM | POA: Insufficient documentation

## 2022-06-26 DIAGNOSIS — E876 Hypokalemia: Secondary | ICD-10-CM | POA: Insufficient documentation

## 2022-06-26 DIAGNOSIS — E860 Dehydration: Secondary | ICD-10-CM | POA: Diagnosis not present

## 2022-06-26 DIAGNOSIS — R0789 Other chest pain: Secondary | ICD-10-CM | POA: Diagnosis present

## 2022-06-26 DIAGNOSIS — G5680 Other specified mononeuropathies of unspecified upper limb: Secondary | ICD-10-CM | POA: Insufficient documentation

## 2022-06-26 DIAGNOSIS — R079 Chest pain, unspecified: Secondary | ICD-10-CM

## 2022-06-26 DIAGNOSIS — Z720 Tobacco use: Secondary | ICD-10-CM

## 2022-06-26 LAB — CBC
HCT: 42.6 % (ref 39.0–52.0)
Hemoglobin: 14.6 g/dL (ref 13.0–17.0)
MCH: 32.8 pg (ref 26.0–34.0)
MCHC: 34.3 g/dL (ref 30.0–36.0)
MCV: 95.7 fL (ref 80.0–100.0)
Platelets: 274 10*3/uL (ref 150–400)
RBC: 4.45 MIL/uL (ref 4.22–5.81)
RDW: 13.2 % (ref 11.5–15.5)
WBC: 10.1 10*3/uL (ref 4.0–10.5)
nRBC: 0 % (ref 0.0–0.2)

## 2022-06-26 LAB — COMPREHENSIVE METABOLIC PANEL
ALT: 19 U/L (ref 0–44)
AST: 20 U/L (ref 15–41)
Albumin: 3.8 g/dL (ref 3.5–5.0)
Alkaline Phosphatase: 61 U/L (ref 38–126)
Anion gap: 7 (ref 5–15)
BUN: 26 mg/dL — ABNORMAL HIGH (ref 6–20)
CO2: 21 mmol/L — ABNORMAL LOW (ref 22–32)
Calcium: 8.9 mg/dL (ref 8.9–10.3)
Chloride: 106 mmol/L (ref 98–111)
Creatinine, Ser: 1.03 mg/dL (ref 0.61–1.24)
GFR, Estimated: >60 mL/min (ref >60)
Glucose, Bld: 108 mg/dL — ABNORMAL HIGH (ref 70–99)
Potassium: 3.6 mmol/L (ref 3.5–5.1)
Sodium: 134 mmol/L — ABNORMAL LOW (ref 135–145)
Total Bilirubin: 0.9 mg/dL (ref 0.3–1.2)
Total Protein: 6.9 g/dL (ref 6.5–8.1)

## 2022-06-26 LAB — ECHOCARDIOGRAM COMPLETE
Area-P 1/2: 3.31 cm2
Height: 72 in
S' Lateral: 2.9 cm
Single Plane A4C EF: 54.6 %
Weight: 2320 oz

## 2022-06-26 LAB — RAPID URINE DRUG SCREEN, HOSP PERFORMED
Amphetamines: NOT DETECTED
Barbiturates: NOT DETECTED
Benzodiazepines: NOT DETECTED
Cocaine: NOT DETECTED
Opiates: NOT DETECTED
Tetrahydrocannabinol: POSITIVE — AB

## 2022-06-26 LAB — MAGNESIUM: Magnesium: 2 mg/dL (ref 1.7–2.4)

## 2022-06-26 LAB — TROPONIN I (HIGH SENSITIVITY): Troponin I (High Sensitivity): 31 ng/L — ABNORMAL HIGH (ref <18)

## 2022-06-26 LAB — HIV ANTIBODY (ROUTINE TESTING W REFLEX): HIV Screen 4th Generation wRfx: NONREACTIVE

## 2022-06-26 LAB — PHOSPHORUS: Phosphorus: 2.9 mg/dL (ref 2.5–4.6)

## 2022-06-26 LAB — CK: Total CK: 178 U/L (ref 49–397)

## 2022-06-26 MED ORDER — POTASSIUM CHLORIDE CRYS ER 20 MEQ PO TBCR
40.0000 meq | EXTENDED_RELEASE_TABLET | Freq: Once | ORAL | Status: AC
  Administered 2022-06-26: 40 meq via ORAL
  Filled 2022-06-26: qty 2

## 2022-06-26 MED ORDER — PANTOPRAZOLE SODIUM 40 MG PO TBEC: 40.0000 mg | DELAYED_RELEASE_TABLET | Freq: Every day | ORAL | 0 refills | Status: AC

## 2022-06-26 MED ORDER — ACETAMINOPHEN 325 MG PO TABS: 650.0000 mg | ORAL_TABLET | Freq: Four times a day (QID) | ORAL | Status: AC | PRN

## 2022-06-26 MED ORDER — ACETAMINOPHEN 650 MG RE SUPP: 650.0000 mg | Freq: Four times a day (QID) | RECTAL | Status: DC | PRN

## 2022-06-26 MED ORDER — SODIUM CHLORIDE 0.9 % IV BOLUS
500.0000 mL | Freq: Once | INTRAVENOUS | Status: AC
  Administered 2022-06-26: 500 mL via INTRAVENOUS

## 2022-06-26 MED ORDER — PANTOPRAZOLE SODIUM 40 MG PO TBEC
40.0000 mg | DELAYED_RELEASE_TABLET | Freq: Every day | ORAL | Status: DC
  Administered 2022-06-26: 40 mg via ORAL
  Filled 2022-06-26: qty 1

## 2022-06-26 MED ORDER — LACTATED RINGERS IV BOLUS
1000.0000 mL | Freq: Once | INTRAVENOUS | Status: AC
  Administered 2022-06-26: 1000 mL via INTRAVENOUS

## 2022-06-26 MED ORDER — ACETAMINOPHEN 325 MG PO TABS
650.0000 mg | ORAL_TABLET | Freq: Four times a day (QID) | ORAL | Status: DC | PRN
  Administered 2022-06-26: 650 mg via ORAL
  Filled 2022-06-26: qty 2

## 2022-06-26 MED ORDER — LIDOCAINE 5 % EX PTCH
1.0000 | MEDICATED_PATCH | CUTANEOUS | Status: DC
  Administered 2022-06-26: 1 via TRANSDERMAL
  Filled 2022-06-26: qty 1

## 2022-06-26 MED ORDER — LIDOCAINE 5 % EX PTCH: 1.0000 | MEDICATED_PATCH | CUTANEOUS | 0 refills | Status: AC

## 2022-06-26 MED ORDER — DICLOFENAC SODIUM 1 % EX GEL
2.0000 g | Freq: Four times a day (QID) | CUTANEOUS | Status: DC
  Filled 2022-06-26: qty 100

## 2022-06-26 MED ORDER — ONDANSETRON HCL 4 MG/2ML IJ SOLN: 4.0000 mg | Freq: Four times a day (QID) | INTRAMUSCULAR | Status: DC | PRN

## 2022-06-26 MED ORDER — ENOXAPARIN SODIUM 40 MG/0.4ML IJ SOSY
40.0000 mg | PREFILLED_SYRINGE | INTRAMUSCULAR | Status: DC
  Administered 2022-06-26: 40 mg via SUBCUTANEOUS
  Filled 2022-06-26: qty 0.4

## 2022-06-26 MED ORDER — NICOTINE 21 MG/24HR TD PT24: 21.0000 mg | MEDICATED_PATCH | Freq: Every day | TRANSDERMAL | 0 refills | Status: AC

## 2022-06-26 MED ORDER — NICOTINE 21 MG/24HR TD PT24
21.0000 mg | MEDICATED_PATCH | Freq: Every day | TRANSDERMAL | Status: DC
  Administered 2022-06-26: 21 mg via TRANSDERMAL
  Filled 2022-06-26: qty 1

## 2022-06-26 MED ORDER — DICLOFENAC SODIUM 1 % EX GEL: 2.0000 g | Freq: Four times a day (QID) | CUTANEOUS | Status: DC

## 2022-06-26 MED ORDER — ONDANSETRON HCL 4 MG PO TABS: 4.0000 mg | ORAL_TABLET | Freq: Four times a day (QID) | ORAL | Status: DC | PRN

## 2022-06-26 NOTE — Discharge Summary (Signed)
Physician Discharge Summary  [name] ZOX:[id number] DOB: [birth year] DOA: 06/25/2022  PCP: Patient, No Pcp Per  Admit date: 06/25/2022 Discharge date: 06/26/2022  Admitted From: home Discharge disposition: home   Recommendations for Outpatient Follow-Up:   Needs to establish with PCP Referral to ortho  for ? Subscapular nerve compression Smoking cessation  stay hydrated while working outside    Discharge Diagnosis:   Principal Problem:   Atypical chest pain Active Problems:   Tobacco abuse   AKI (acute kidney injury) (HCC)   Dehydration   Hypokalemia   Suprascapular nerve compression    Discharge Condition: Improved.  Diet recommendation:  Regular.  Wound care: None.  Code status: Full.   History of Present Illness:   [name] is a 42 y.o. male with medical history significant of tobacco abuse who presents to the emergency department due to chest tightness which started at work (patient works outdoor- cut trees), chest tightness was reproducible and was described as intermittent squeezing sensation with Chest with radiation towards the back side of left neck and left arm numbness.  Patient states that he has had prior 6-7 similar episodes in the past, but none lasted this long.  He took aspirin 324 mg without much relief.  He activated EMS and was taken to the ED for further evaluation and management.   ED Course:  In the emergency department, temperature was 97.5 F, BP was 113/92, other vital signs were within normal range.  Workup in the ED showed normal CBC except WBC of 14.5 and hemoglobin 17.5.  BMP showed sodium 134, potassium 3.4, chloride 101, bicarb 20, blood glucose 93, BUN/creatinine 29/1.81 (baseline creatinine at 0.8-0.9), EGFR 48.  Troponin x 2 53 > 31.  Total CK1 178. Chest x-ray showed no active cardiopulmonary disease Chest pain was relieved with nitroglycerin.  Cardiology was consulted and does not think that patient needs) transfer  to [facility] for cardiology consult.  Hospitalist was asked to admit patient for further evaluation and management   Hospital Course by Problem:   Atypical Chest Pain-- more of a left arm nerve pain that appears to worsen with overhead movements Troponins x2 -53 > 31 EKG showed normal sinus rhythm at 78 Echocardiogram done and results below -exercises given for nerve/arm issues, had CT scan of neck in past, ortho referral for continued treatment if conservative measures do not work  Suspected gastritis -avoid NSAIDs for now -PPI   Acute kidney injury due to Dehydration  BUN/creatinine 29/1.81 (baseline creatinine at 0.8-0.9) -resolved with IVF   Hypokalemia -replete   Tobacco abuse Patient was counseled on tobacco abuse cessation    Medical Consultants:   Cards (phone)   Discharge Exam:   Vitals:   06/26/22 0806 06/26/22 1123  BP: (!) 90/57 108/75  Pulse: 73 65  Resp: 14 16  Temp: 98.2 F (36.8 C) 98 F (36.7 C)  SpO2: 98% 99%   Vitals:   06/26/22 0225 06/26/22 0318 06/26/22 0806 06/26/22 1123  BP: 101/70 116/78 (!) 90/57 108/75  Pulse: 79 70 73 65  Resp: 15 18 14 16   Temp: 97.6 F (36.4 C) 97.8 F (36.6 C) 98.2 F (36.8 C) 98 F (36.7 C)  TempSrc: Oral Oral Oral Oral  SpO2: 98% 98% 98% 99%  Weight:      Height:        General exam: Appears calm and comfortable.    The results of significant diagnostics from this hospitalization (including imaging, microbiology, ancillary  and laboratory) are listed below for reference.     Procedures and Diagnostic Studies:   ECHOCARDIOGRAM COMPLETE  Result Date: 06/26/2022    ECHOCARDIOGRAM REPORT   Patient Name:   [name] Date of Exam: 06/26/2022 Medical Rec #:  [id number]        Height:       72.0 in Accession #:    [id number]       Weight:       145.0 lb Date of Birth:  1980-09-05        BSA:          1.858 m Patient Age:    41 years         BP:           108/75 mmHg Patient Gender: M                HR:            64 bpm. [facility] Procedure: 2D Echo, Cardiac Doppler and Color Doppler Indications:    chest pain  History:        Patient has no prior history of Echocardiogram examinations.                 Risk Factors:Current Smoker.  Sonographer:    Delcie Roch RDCS Referring Phys: [id number] OLADAPO ADEFESO  Sonographer Comments: Image acquisition challenging due to respiratory motion. IMPRESSIONS  1. Left ventricular ejection fraction, by estimation, is 55 to 60%. The left ventricle has normal function. The left ventricle has no regional wall motion abnormalities. Left ventricular diastolic parameters were normal.  2. Right ventricular systolic function is normal. The right ventricular size is normal. Tricuspid regurgitation signal is inadequate for assessing PA pressure.  3. The mitral valve is grossly normal. Trivial mitral valve regurgitation.  4. The aortic valve was not well visualized. Aortic valve regurgitation is not visualized. No aortic stenosis is present.  5. The inferior vena cava is normal in size with greater than 50% respiratory variability, suggesting right atrial pressure of 3 mmHg. Comparison(s): No prior Echocardiogram. FINDINGS  Left Ventricle: Left ventricular ejection fraction, by estimation, is 55 to 60%. The left ventricle has normal function. The left ventricle has no regional wall motion abnormalities. The left ventricular internal cavity size was normal in size. There is  no left ventricular hypertrophy. Left ventricular diastolic parameters were normal. Right Ventricle: The right ventricular size is normal. No increase in right ventricular wall thickness. Right ventricular systolic function is normal. Tricuspid regurgitation signal is inadequate for assessing PA pressure. Left Atrium: Left atrial size was normal in size. Right Atrium: Right atrial size was normal in size. Pericardium: There is no evidence of pericardial effusion. Mitral Valve: The mitral valve is  grossly normal. Trivial mitral valve regurgitation. Tricuspid Valve: The tricuspid valve is normal in structure. Tricuspid valve regurgitation is not demonstrated. Aortic Valve: The aortic valve was not well visualized. Aortic valve regurgitation is not visualized. No aortic stenosis is present. Pulmonic Valve: The pulmonic valve was not well visualized. Pulmonic valve regurgitation is not visualized. Aorta: The aortic root is normal in size and structure. Venous: The inferior vena cava is normal in size with greater than 50% respiratory variability, suggesting right atrial pressure of 3 mmHg. IAS/Shunts: The atrial septum is grossly normal.  LEFT VENTRICLE PLAX 2D LVIDd:         4.30 cm     Diastology LVIDs:  2.90 cm     LV e' medial:    12.40 cm/s LV PW:         0.90 cm     LV E/e' medial:  5.3 LV IVS:        0.70 cm     LV e' lateral:   16.80 cm/s LVOT diam:     2.10 cm     LV E/e' lateral: 3.9 LV SV:         68 LV SV Index:   37 LVOT Area:     3.46 cm  LV Volumes (MOD) LV vol d, MOD A4C: 73.4 ml LV vol s, MOD A4C: 33.3 ml LV SV MOD A4C:     73.4 ml RIGHT VENTRICLE             IVC RV Basal diam:  2.70 cm     IVC diam: 1.70 cm RV S prime:     11.10 cm/s TAPSE (M-mode): 1.8 cm LEFT ATRIUM             Index        RIGHT ATRIUM           Index LA diam:        3.10 cm 1.67 cm/m   RA Area:     13.60 cm LA Vol (A2C):   44.5 ml 23.95 ml/m  RA Volume:   33.70 ml  18.13 ml/m LA Vol (A4C):   39.7 ml 21.36 ml/m LA Biplane Vol: 42.4 ml 22.82 ml/m  AORTIC VALVE LVOT Vmax:   97.30 cm/s LVOT Vmean:  60.000 cm/s LVOT VTI:    0.197 m MITRAL VALVE MV Area (PHT): 3.31 cm    SHUNTS MV Decel Time: 229 msec    Systemic VTI:  0.20 m MV E velocity: 65.60 cm/s  Systemic Diam: 2.10 cm MV A velocity: 39.00 cm/s MV E/A ratio:  1.68 Laurance Flatten MD Electronically signed by Laurance Flatten MD Signature Date/Time: 06/26/2022/12:38:42 PM    Final    DG Chest 2 View  Result Date: 06/25/2022 CLINICAL DATA:  Chest pain EXAM:  CHEST - 2 VIEW COMPARISON:  Chest x-ray 08/02/2014 FINDINGS: The heart size and mediastinal contours are within normal limits. Both lungs are clear. The visualized skeletal structures are unremarkable. IMPRESSION: No active cardiopulmonary disease. Electronically Signed   By: Darliss Cheney M.D.   On: 06/25/2022 21:45     Labs:   Basic Metabolic Panel: Recent Labs  Lab 06/25/22 2131 06/26/22 0514  NA 134* 134*  K 3.4* 3.6  CL 101 106  CO2 20* 21*  GLUCOSE 93 108*  BUN 29* 26*  CREATININE 1.81* 1.03  CALCIUM 10.2 8.9  MG  --  2.0  PHOS  --  2.9   GFR Estimated Creatinine Clearance: 88.7 mL/min (by C-G formula based on SCr of 1.03 mg/dL). Liver Function Tests: Recent Labs  Lab 06/26/22 0514  AST 20  ALT 19  ALKPHOS 61  BILITOT 0.9  PROT 6.9  ALBUMIN 3.8   No results for input(s): "LIPASE", "AMYLASE" in the last 168 hours. No results for input(s): "AMMONIA" in the last 168 hours. Coagulation profile No results for input(s): "INR", "PROTIME" in the last 168 hours.  CBC: Recent Labs  Lab 06/25/22 2131 06/26/22 0514  WBC 14.5* 10.1  NEUTROABS 8.2*  --   HGB 17.5* 14.6  HCT 49.5 42.6  MCV 93.4 95.7  PLT 319 274   Cardiac Enzymes: Recent Labs  Lab 06/25/22 2319  CKTOTAL  178   BNP: Invalid input(s): "POCBNP" CBG: No results for input(s): "GLUCAP" in the last 168 hours. D-Dimer No results for input(s): "DDIMER" in the last 72 hours. Hgb A1c No results for input(s): "HGBA1C" in the last 72 hours. Lipid Profile No results for input(s): "CHOL", "HDL", "LDLCALC", "TRIG", "CHOLHDL", "LDLDIRECT" in the last 72 hours. Thyroid function studies No results for input(s): "TSH", "T4TOTAL", "T3FREE", "THYROIDAB" in the last 72 hours.  Invalid input(s): "FREET3" Anemia work up No results for input(s): "VITAMINB12", "FOLATE", "FERRITIN", "TIBC", "IRON", "RETICCTPCT" in the last 72 hours. Microbiology No results found for this or any previous visit (from the past 240  hour(s)).   Discharge Instructions:   Discharge Instructions     Ambulatory referral to Orthopedics   Complete by: As directed    Diet general   Complete by: As directed    Discharge instructions   Complete by: As directed    Stop smoking! Need to establish with primary care physician  Can use protonix or buy another PPI OTC like pecid etc-- avoid NSAIDs for now Avoid repetitive overhead movements- do the stretches from print off and will refer you to ortho doc   Increase activity slowly   Complete by: As directed       Allergies as of 06/26/2022       Reactions   Ceclor [cefaclor] Rash        Medication List     STOP taking these medications    diazepam 5 MG tablet Commonly known as: VALIUM   HYDROcodone-acetaminophen 5-325 MG tablet Commonly known as: NORCO/VICODIN   ibuprofen 800 MG tablet Commonly known as: ADVIL   oxyCODONE 5 MG immediate release tablet Commonly known as: Oxy IR/ROXICODONE       TAKE these medications    acetaminophen 325 MG tablet Commonly known as: TYLENOL Take 2 tablets (650 mg total) by mouth every 6 (six) hours as needed for mild pain (or Fever >/= 101).   diclofenac Sodium 1 % Gel Commonly known as: VOLTAREN Apply 2 g topically 4 (four) times daily.   lidocaine 5 % Commonly known as: LIDODERM Place 1 patch onto the skin daily. Remove & Discard patch within 12 hours or as directed by MD   nicotine 21 mg/24hr patch Commonly known as: NICODERM CQ - dosed in mg/24 hours Place 1 patch (21 mg total) onto the skin daily.   pantoprazole 40 MG tablet Commonly known as: PROTONIX Take 1 tablet (40 mg total) by mouth daily.          Time coordinating discharge: 45 min  Signed:  Obaloluwa Art DO  [facility] 06/26/2022, 1:04 PM

## 2022-06-26 NOTE — Progress Notes (Signed)
Patient lying in bed with eyes closed resting. Patient complained of left shoulder/arm chronic pain upon arrival to floor 300. PRN pain medication given. Per pt. Medication was effective. No further complaints or concerns verbalized from patient. Will continue to monitor and report off to the oncoming shift.

## 2022-06-26 NOTE — H&P (Addendum)
History and Physical    Patient: [name] ZOX:[id number] DOB: [birth year] DOA: 06/25/2022 DOS: the patient was seen and examined on 06/26/2022 PCP: Patient, No Pcp Per  Patient coming from: Home  Chief Complaint:  Chief Complaint  Patient presents with   Chest Pain   HPI: [name] is a 42 y.o. male with medical history significant of tobacco abuse who presents to the emergency department due to chest tightness which started at work (patient works outdoor- cut trees), chest tightness was reproducible and was described as intermittent squeezing sensation with Chest with radiation towards the back side of left neck and left arm numbness.  Patient states that he has had prior 6-7 similar episodes in the past, but none lasted this long.  He took aspirin 324 mg without much relief.  He activated EMS and was taken to the ED for further evaluation and management.  ED Course:  In the emergency department, temperature was 97.5 F, BP was 113/92, other vital signs were within normal range.  Workup in the ED showed normal CBC except WBC of 14.5 and hemoglobin 17.5.  BMP showed sodium 134, potassium 3.4, chloride 101, bicarb 20, blood glucose 93, BUN/creatinine 29/1.81 (baseline creatinine at 0.8-0.9), EGFR 48.  Troponin x 2 53 > 31.  Total CK1 178. Chest x-ray showed no active cardiopulmonary disease Chest pain was relieved with nitroglycerin.  Cardiology was consulted and does not think that patient needs) transfer to [facility] for cardiology consult.  Hospitalist was asked to admit patient for further evaluation and management  Review of Systems: Review of systems as noted in the HPI. All other systems reviewed and are negative.   Past Medical History:  Diagnosis Date   Collapsed lung    Renal disorder    kidney stones   Past Surgical History:  Procedure Laterality Date   APPENDECTOMY     STAPLING OF BLEBS Right 07/09/2014   Procedure: STAPLING OF BLEBS;  Surgeon: Alleen Borne,  MD;  Location: [facility] OR;  Service: Thoracic;  Laterality: Right;   VIDEO ASSISTED THORACOSCOPY Right 07/09/2014   Procedure: VIDEO ASSISTED THORACOSCOPY;  Surgeon: Alleen Borne, MD;  Location: [facility] OR;  Service: Thoracic;  Laterality: Right;    Social History:  reports that he has been smoking cigarettes. He has a 14.00 pack-year smoking history. He has never used smokeless tobacco. He reports current alcohol use. He reports current drug use. Drug: Marijuana.   Allergies  Allergen Reactions   Ceclor [Cefaclor] Rash    Family History  Problem Relation Age of Onset   Cancer Maternal Aunt      Prior to Admission medications   Medication Sig Start Date End Date Taking? Authorizing Provider  diazepam (VALIUM) 5 MG tablet Take 1 tablet (5 mg total) by mouth 2 (two) times daily. 12/04/20   Jacalyn Lefevre, MD  HYDROcodone-acetaminophen (NORCO/VICODIN) 5-325 MG tablet Take 1 tablet by mouth every 4 (four) hours as needed. 12/04/20   Jacalyn Lefevre, MD  ibuprofen (ADVIL) 800 MG tablet Take 1 tablet (800 mg total) by mouth 3 (three) times daily. 12/04/20   Jacalyn Lefevre, MD  oxyCODONE (OXY IR/ROXICODONE) 5 MG immediate release tablet Take 1-2 tablets (5-10 mg total) by mouth every 4 (four) hours as needed for severe pain. Patient not taking: Reported on 12/04/2020 07/12/14   Ardelle Balls, PA-C    Physical Exam: BP 116/78 (BP Location: Left Arm)   Pulse 70   Temp 97.8 F (36.6 C) (Oral)   Resp  18   Ht 6' (1.829 m)   Wt 65.8 kg   SpO2 98%   BMI 19.67 kg/m   General: 42 y.o. year-old male well developed well nourished in no acute distress.  Alert and oriented x3. HEENT: NCAT, EOMI, dry mucous membrane Neck: Supple, trachea medial Cardiovascular: Regular rate and rhythm with no rubs or gallops.  No thyromegaly or JVD noted.  No lower extremity edema. 2/4 pulses in all 4 extremities. Respiratory: Clear to auscultation with no wheezes or rales. Good inspiratory effort. Abdomen:  Soft, nontender nondistended with normal bowel sounds x4 quadrants. Muskuloskeletal: No cyanosis, clubbing or edema noted bilaterally Neuro: CN II-XII intact, strength 5/5 x 4, sensation, reflexes intact Skin: No ulcerative lesions noted or rashes Psychiatry: Judgement and insight appear normal. Mood is appropriate for condition and setting          Labs on Admission:  Basic Metabolic Panel: Recent Labs  Lab 06/25/22 2131  NA 134*  K 3.4*  CL 101  CO2 20*  GLUCOSE 93  BUN 29*  CREATININE 1.81*  CALCIUM 10.2   Liver Function Tests: No results for input(s): "AST", "ALT", "ALKPHOS", "BILITOT", "PROT", "ALBUMIN" in the last 168 hours. No results for input(s): "LIPASE", "AMYLASE" in the last 168 hours. No results for input(s): "AMMONIA" in the last 168 hours. CBC: Recent Labs  Lab 06/25/22 2131  WBC 14.5*  NEUTROABS 8.2*  HGB 17.5*  HCT 49.5  MCV 93.4  PLT 319   Cardiac Enzymes: Recent Labs  Lab 06/25/22 2319  CKTOTAL 178    BNP (last 3 results) No results for input(s): "BNP" in the last 8760 hours.  ProBNP (last 3 results) No results for input(s): "PROBNP" in the last 8760 hours.  CBG: No results for input(s): "GLUCAP" in the last 168 hours.  Radiological Exams on Admission: DG Chest 2 View  Result Date: 06/25/2022 CLINICAL DATA:  Chest pain EXAM: CHEST - 2 VIEW COMPARISON:  Chest x-ray 08/02/2014 FINDINGS: The heart size and mediastinal contours are within normal limits. Both lungs are clear. The visualized skeletal structures are unremarkable. IMPRESSION: No active cardiopulmonary disease. Electronically Signed   By: Darliss Cheney M.D.   On: 06/25/2022 21:45    EKG: I independently viewed the EKG done and my findings are as followed: Normal sinus rhythm at a rate of 78 bpm  Assessment/Plan Present on Admission:  Atypical chest pain  Tobacco abuse  Principal Problem:   Atypical chest pain Active Problems:   Tobacco abuse   AKI (acute kidney injury)  (HCC)   Dehydration   Hypokalemia  Atypical chest pain rule out ACS  Atypical Chest Pain Continue telemetry  Troponins x2 -53 > 31 EKG showed normal sinus rhythm at 78 Echocardiogram will be done in the morning  Give aspirin, nitroglycerin prn  Acute kidney injury Dehydration  BUN/creatinine 29/1.81 (baseline creatinine at 0.8-0.9) Continue gentle hydration Renally adjust medications, avoid nephrotoxic agents/dehydration/hypotension  Hypokalemia K+ 3.4, this will be replenished  Tobacco abuse Patient was counseled on tobacco abuse cessation    DVT prophylaxis: Lovenox   Advance Care Planning:   Code Status: Full Code   Consults:None  Family Communication: None at bedside   Severity of Illness: The appropriate patient status for this patient is OBSERVATION. Observation status is judged to be reasonable and necessary in order to provide the required intensity of service to ensure the patient's safety. The patient's presenting symptoms, physical exam findings, and initial radiographic and laboratory data in the context of their  medical condition is felt to place them at decreased risk for further clinical deterioration. Furthermore, it is anticipated that the patient will be medically stable for discharge from the hospital within 2 midnights of admission.   Author: Frankey Shown, DO 06/26/2022 3:47 AM  For on call review [email or web address].

## 2022-06-26 NOTE — ED Notes (Signed)
ED Provider at bedside. 

## 2022-06-26 NOTE — TOC Progression Note (Signed)
Transition of Care [facility]) - Progression Note    Patient Details  Name: Akshaj Sparhawk MRN: [id number] Date of Birth: 04/10/1980  Transition of Care [facility]) CM/SW Contact  Catalina Gravel, LCSW Phone Number: 06/26/2022, 1:58 PM  Clinical Narrative:     .Marland Kitchen Transition of Care [facility]) Screening Note   Patient Details  Name: Shanen Santarosa Date of Birth: 1980/07/29   Transition of Care (TOC) CM/SW Contact:    Catalina Gravel, LCSW Phone Number: 06/26/2022, 1:59 PM    Transition of Care Department [facility]) has reviewed patient and no TOC needs have been identified at this time. We will continue to monitor patient advancement through interdisciplinary progression rounds. If new patient transition needs arise, please place a TOC consult.      Barriers to Discharge: No Barriers Identified  Expected Discharge Plan and Services         Expected Discharge Date: 06/26/22                                     Social Determinants of Health (SDOH) Interventions SDOH Screenings   Tobacco Use: High Risk (06/25/2022)    Readmission Risk Interventions     No data to display

## 2022-06-26 NOTE — Progress Notes (Signed)
Pt discharged to home. Dc instructions given. Encouraged to stop by pharmacy and pick up med that was e-prescribed. Voiced understanding.

## 2022-06-26 NOTE — Progress Notes (Signed)
  Echocardiogram 2D Echocardiogram has been performed.  Delcie Roch 06/26/2022, 12:25 PM

## 2023-09-26 ENCOUNTER — Encounter: Admitting: Gastroenterology

## 2023-09-29 ENCOUNTER — Encounter (INDEPENDENT_AMBULATORY_CARE_PROVIDER_SITE_OTHER): Payer: Self-pay | Admitting: Gastroenterology

## 2023-09-29 ENCOUNTER — Ambulatory Visit (INDEPENDENT_AMBULATORY_CARE_PROVIDER_SITE_OTHER): Payer: Self-pay | Admitting: Gastroenterology

## 2023-09-29 VITALS — BP 119/77 | HR 77 | Temp 97.8°F | Ht 72.0 in | Wt 144.8 lb

## 2023-09-29 DIAGNOSIS — R109 Unspecified abdominal pain: Secondary | ICD-10-CM

## 2023-09-29 DIAGNOSIS — K625 Hemorrhage of anus and rectum: Secondary | ICD-10-CM | POA: Diagnosis not present

## 2023-09-29 DIAGNOSIS — R1030 Lower abdominal pain, unspecified: Secondary | ICD-10-CM | POA: Insufficient documentation

## 2023-09-29 DIAGNOSIS — R194 Change in bowel habit: Secondary | ICD-10-CM | POA: Diagnosis not present

## 2023-09-29 DIAGNOSIS — K529 Noninfective gastroenteritis and colitis, unspecified: Secondary | ICD-10-CM | POA: Insufficient documentation

## 2023-09-29 NOTE — H&P (View-Only) (Signed)
 Toribio Fortune, M.D. Gastroenterology & Hepatology [facility] Gastroenterology [address] [place], [place] [address] Primary Care Physician: Loring Lye, MD [address] [place] [address]  Referring MD: PCP  Chief Complaint: Rectal bleeding  History of Present Illness: [name] is a 43 y.o. male with past medical history of kidney stones, who presents for evaluation of rectal bleeding.  Patient reports that for the last 4 years he has presented intermittent rectal bleeding - sometimes it is just blood that comes out and sometimes it is mixed with the stool. He is worried as more recently he has noticed this is happening on a regular basis. He endorses having some dyschezia. He also endorses fluctuation in his bowel movements, as he has recently noticed watery diarrhea recently. He reports that he tries to clench to avoid any fecal leakage and is very hard to do this. No mucus in the stool. He is currently having 4-5 Bms as well for the last 4 years.  Patient reports that he has also felt some issues with abdominal pain in the right side of the abdomen, as well as some gurgling sounds in his abdomen.  Has history of GERD and take pantoprazole  40 mg as needed as he does not have too many symptoms.  The patient denies having any nausea, vomiting, fever, chills, hematochezia, melena, hematemesis, abdominal distention, jaundice, pruritus or weight loss.  Last ZHI:wzczm Last Colonoscopy:never  FHx: neg for any gastrointestinal/liver disease, no malignancies Social: smokes 1-2 packs a day, neg alcohol or illicit drug use Surgical: appendectomy and inguinal hernia  Past Medical History: Past Medical History:  Diagnosis Date   Collapsed lung    Renal disorder    kidney stones    Past Surgical History: Past Surgical History:  Procedure Laterality Date   APPENDECTOMY     STAPLING OF BLEBS Right 07/09/2014   Procedure: STAPLING OF  BLEBS;  Surgeon: Dorise MARLA Fellers, MD;  Location: [facility] OR;  Service: Thoracic;  Laterality: Right;   VIDEO ASSISTED THORACOSCOPY Right 07/09/2014   Procedure: VIDEO ASSISTED THORACOSCOPY;  Surgeon: Dorise MARLA Fellers, MD;  Location: [facility] OR;  Service: Thoracic;  Laterality: Right;    Family History: Family History  Problem Relation Age of Onset   Cancer Maternal Aunt     Social History: Social History   Tobacco Use  Smoking Status Every Day   Current packs/day: 1.00   Average packs/day: 1 pack/day for 14.0 years (14.0 ttl pk-yrs)   Types: Cigarettes  Smokeless Tobacco Never   Social History   Substance and Sexual Activity  Alcohol Use Yes   Comment: rarely   Social History   Substance and Sexual Activity  Drug Use Not Currently   Types: Marijuana    Allergies: Allergies  Allergen Reactions   Augmentin [Amoxicillin-Pot Clavulanate]     Unknown reaction   Ceclor [Cefaclor] Rash    Medications: Current Outpatient Medications  Medication Sig Dispense Refill   acetaminophen  (TYLENOL ) 325 MG tablet Take 2 tablets (650 mg total) by mouth every 6 (six) hours as needed for mild pain (or Fever >/= 101).     FIBER ADULT GUMMIES PO Take 1 tablet by mouth daily at 6 (six) AM. (Patient taking differently: Take 2 tablets by mouth daily at 6 (six) AM.)     lidocaine  (LIDODERM ) 5 % Place 1 patch onto the skin daily. Remove & Discard patch within 12 hours or as directed by MD (Patient taking differently: Place 1 patch onto  the skin daily. Remove & Discard patch within 12 hours or as directed by MD as needed.) 30 patch 0   nicotine  (NICODERM CQ  - DOSED IN MG/24 HOURS) 21 mg/24hr patch Place 1 patch (21 mg total) onto the skin daily. (Patient taking differently: Place 21 mg onto the skin as needed.) 28 patch 0   pantoprazole  (PROTONIX ) 40 MG tablet Take 1 tablet (40 mg total) by mouth daily. (Patient taking differently: Take 40 mg by mouth as needed.) 30 tablet 0   No current  facility-administered medications for this visit.    Review of Systems: GENERAL: negative for malaise, night sweats HEENT: No changes in hearing or vision, no nose bleeds or other nasal problems. NECK: Negative for lumps, goiter, pain and significant neck swelling RESPIRATORY: Negative for cough, wheezing CARDIOVASCULAR: Negative for chest pain, leg swelling, palpitations, orthopnea GI: SEE HPI MUSCULOSKELETAL: Negative for joint pain or swelling, back pain, and muscle pain. SKIN: Negative for lesions, rash PSYCH: Negative for sleep disturbance, mood disorder and recent psychosocial stressors. HEMATOLOGY Negative for prolonged bleeding, bruising easily, and swollen nodes. ENDOCRINE: Negative for cold or heat intolerance, polyuria, polydipsia and goiter. NEURO: negative for tremor, gait imbalance, syncope and seizures. The remainder of the review of systems is noncontributory.   Physical Exam: BP 119/77 (BP Location: Left Arm, Patient Position: Sitting, Cuff Size: Normal)   Pulse 77   Temp 97.8 F (36.6 C) (Temporal)   Ht 6' (1.829 m)   Wt 144 lb 12.8 oz (65.7 kg)   BMI 19.64 kg/m  GENERAL: The patient is AO x3, in no acute distress. HEENT: Head is normocephalic and atraumatic. EOMI are intact. Mouth is well hydrated and without lesions. NECK: Supple. No masses LUNGS: Clear to auscultation. No presence of rhonchi/wheezing/rales. Adequate chest expansion HEART: RRR, normal s1 and s2. ABDOMEN: tender to palpation in the lower abdomen and RUQ, no guarding, no peritoneal signs, and nondistended. BS +. No masses. EXTREMITIES: Without any cyanosis, clubbing, rash, lesions or edema. NEUROLOGIC: AOx3, no focal motor deficit. SKIN: no jaundice, no rashes   Imaging/Labs: as above  I personally reviewed and interpreted the available labs, imaging and endoscopic files.  Impression and Plan: [name] is a 43 y.o. male with past medical history of kidney stones, who presents for  evaluation of rectal bleeding.  Patient has presented recurrent episodes of rectal bleeding throughout the years, along with changes in his bowel movement and abdominal pain.  I discussed with him that this is concerning given the chronicity of his symptoms And should undergo proper evaluation with a colonoscopy.  Differential diagnosis include inflammatory bowel disease (especially ulcerative colitis), less likely related to malignancy or cancer.  Also encouraged the patient to work on smoking cessation.  -Schedule colonoscopy - Smoking cessation  All questions were answered.      Toribio Fortune, MD Gastroenterology and Hepatology [facility] Gastroenterology

## 2023-09-29 NOTE — Progress Notes (Unsigned)
 Toribio Fortune, M.D. Gastroenterology & Hepatology [facility] Gastroenterology [address] [place], [place] [address] Primary Care Physician: Loring Lye, MD [address] [place] [address]  Referring MD: PCP  Chief Complaint: Rectal bleeding  History of Present Illness: [name] is a 43 y.o. male with past medical history of kidney stones, who presents for evaluation of rectal bleeding.  Patient reports that for the last 4 years he has presented intermittent rectal bleeding - sometimes it is just blood that comes out and sometimes it is mixed with the stool. He is worried as more recently he has noticed this is happening on a regular basis. He endorses having some dyschezia. He also endorses fluctuation in his bowel movements, as he has recently noticed watery diarrhea recently. He reports that he tries to clench to avoid any fecal leakage and is very hard to do this. No mucus in the stool. He is currently having 4-5 Bms as well for the last 4 years.  Patient reports that he has also felt some issues with abdominal pain in the right side of the abdomen, as well as some gurgling sounds in his abdomen.  Has history of GERD and take pantoprazole  40 mg as needed as he does not have too many symptoms.  The patient denies having any nausea, vomiting, fever, chills, hematochezia, melena, hematemesis, abdominal distention, jaundice, pruritus or weight loss.  Last ZHI:wzczm Last Colonoscopy:never  FHx: neg for any gastrointestinal/liver disease, no malignancies Social: smokes 1-2 packs a day, neg alcohol or illicit drug use Surgical: appendectomy and inguinal hernia  Past Medical History: Past Medical History:  Diagnosis Date   Collapsed lung    Renal disorder    kidney stones    Past Surgical History: Past Surgical History:  Procedure Laterality Date   APPENDECTOMY     STAPLING OF BLEBS Right 07/09/2014   Procedure: STAPLING OF  BLEBS;  Surgeon: Dorise MARLA Fellers, MD;  Location: [facility] OR;  Service: Thoracic;  Laterality: Right;   VIDEO ASSISTED THORACOSCOPY Right 07/09/2014   Procedure: VIDEO ASSISTED THORACOSCOPY;  Surgeon: Dorise MARLA Fellers, MD;  Location: [facility] OR;  Service: Thoracic;  Laterality: Right;    Family History: Family History  Problem Relation Age of Onset   Cancer Maternal Aunt     Social History: Social History   Tobacco Use  Smoking Status Every Day   Current packs/day: 1.00   Average packs/day: 1 pack/day for 14.0 years (14.0 ttl pk-yrs)   Types: Cigarettes  Smokeless Tobacco Never   Social History   Substance and Sexual Activity  Alcohol Use Yes   Comment: rarely   Social History   Substance and Sexual Activity  Drug Use Not Currently   Types: Marijuana    Allergies: Allergies  Allergen Reactions   Augmentin [Amoxicillin-Pot Clavulanate]     Unknown reaction   Ceclor [Cefaclor] Rash    Medications: Current Outpatient Medications  Medication Sig Dispense Refill   acetaminophen  (TYLENOL ) 325 MG tablet Take 2 tablets (650 mg total) by mouth every 6 (six) hours as needed for mild pain (or Fever >/= 101).     FIBER ADULT GUMMIES PO Take 1 tablet by mouth daily at 6 (six) AM. (Patient taking differently: Take 2 tablets by mouth daily at 6 (six) AM.)     lidocaine  (LIDODERM ) 5 % Place 1 patch onto the skin daily. Remove & Discard patch within 12 hours or as directed by MD (Patient taking differently: Place 1 patch onto  the skin daily. Remove & Discard patch within 12 hours or as directed by MD as needed.) 30 patch 0   nicotine  (NICODERM CQ  - DOSED IN MG/24 HOURS) 21 mg/24hr patch Place 1 patch (21 mg total) onto the skin daily. (Patient taking differently: Place 21 mg onto the skin as needed.) 28 patch 0   pantoprazole  (PROTONIX ) 40 MG tablet Take 1 tablet (40 mg total) by mouth daily. (Patient taking differently: Take 40 mg by mouth as needed.) 30 tablet 0   No current  facility-administered medications for this visit.    Review of Systems: GENERAL: negative for malaise, night sweats HEENT: No changes in hearing or vision, no nose bleeds or other nasal problems. NECK: Negative for lumps, goiter, pain and significant neck swelling RESPIRATORY: Negative for cough, wheezing CARDIOVASCULAR: Negative for chest pain, leg swelling, palpitations, orthopnea GI: SEE HPI MUSCULOSKELETAL: Negative for joint pain or swelling, back pain, and muscle pain. SKIN: Negative for lesions, rash PSYCH: Negative for sleep disturbance, mood disorder and recent psychosocial stressors. HEMATOLOGY Negative for prolonged bleeding, bruising easily, and swollen nodes. ENDOCRINE: Negative for cold or heat intolerance, polyuria, polydipsia and goiter. NEURO: negative for tremor, gait imbalance, syncope and seizures. The remainder of the review of systems is noncontributory.   Physical Exam: BP 119/77 (BP Location: Left Arm, Patient Position: Sitting, Cuff Size: Normal)   Pulse 77   Temp 97.8 F (36.6 C) (Temporal)   Ht 6' (1.829 m)   Wt 144 lb 12.8 oz (65.7 kg)   BMI 19.64 kg/m  GENERAL: The patient is AO x3, in no acute distress. HEENT: Head is normocephalic and atraumatic. EOMI are intact. Mouth is well hydrated and without lesions. NECK: Supple. No masses LUNGS: Clear to auscultation. No presence of rhonchi/wheezing/rales. Adequate chest expansion HEART: RRR, normal s1 and s2. ABDOMEN: tender to palpation in the lower abdomen and RUQ, no guarding, no peritoneal signs, and nondistended. BS +. No masses. EXTREMITIES: Without any cyanosis, clubbing, rash, lesions or edema. NEUROLOGIC: AOx3, no focal motor deficit. SKIN: no jaundice, no rashes   Imaging/Labs: as above  I personally reviewed and interpreted the available labs, imaging and endoscopic files.  Impression and Plan: [name] is a 43 y.o. male with past medical history of kidney stones, who presents for  evaluation of rectal bleeding.  Patient has presented recurrent episodes of rectal bleeding throughout the years, along with changes in his bowel movement and abdominal pain.  I discussed with him that this is concerning given the chronicity of his symptoms And should undergo proper evaluation with a colonoscopy.  Differential diagnosis include inflammatory bowel disease (especially ulcerative colitis), less likely related to malignancy or cancer.  Also encouraged the patient to work on smoking cessation.  -Schedule colonoscopy - Smoking cessation  All questions were answered.      Toribio Fortune, MD Gastroenterology and Hepatology [facility] Gastroenterology

## 2023-09-29 NOTE — Patient Instructions (Signed)
 Schedule colonoscopy

## 2023-10-04 ENCOUNTER — Other Ambulatory Visit: Payer: Self-pay | Admitting: *Deleted

## 2023-10-04 ENCOUNTER — Encounter: Payer: Self-pay | Admitting: *Deleted

## 2023-10-04 MED ORDER — PEG 3350-KCL-NA BICARB-NACL 420 G PO SOLR: 4000.0000 mL | Freq: Once | ORAL | 0 refills | Status: AC

## 2023-10-19 ENCOUNTER — Ambulatory Visit: Admitting: Internal Medicine

## 2023-10-27 NOTE — Anesthesia Preprocedure Evaluation (Signed)
 Anesthesia Evaluation  Patient identified by MRN, date of birth, ID band Patient awake    Reviewed: Allergy & Precautions, H&P , NPO status , Patient's Chart, lab work & pertinent test results, reviewed documented beta blocker date and time   Airway Mallampati: II  TM Distance: >3 FB Neck ROM: full    Dental no notable dental hx. (+) Dental Advisory Given, Teeth Intact   Pulmonary Current Smoker History pneumothorax and video thoracoscopy   Pulmonary exam normal breath sounds clear to auscultation       Cardiovascular Exercise Tolerance: Good negative cardio ROS Normal cardiovascular exam Rhythm:regular Rate:Normal  Echo OK   Neuro/Psych Suprascapular nerve compression  Neuromuscular disease  negative psych ROS   GI/Hepatic Neg liver ROS,,,Rectal bleeding   Endo/Other  negative endocrine ROS    Renal/GU Renal disease  negative genitourinary   Musculoskeletal   Abdominal   Peds  Hematology negative hematology ROS (+)   Anesthesia Other Findings   Reproductive/Obstetrics negative OB ROS                              Anesthesia Physical Anesthesia Plan  ASA: 2  Anesthesia Plan: General   Post-op Pain Management: Minimal or no pain anticipated   Induction: Intravenous  PONV Risk Score and Plan: Propofol  infusion  Airway Management Planned: Nasal Cannula and Natural Airway  Additional Equipment: None  Intra-op Plan:   Post-operative Plan:   Informed Consent: I have reviewed the patients History and Physical, chart, labs and discussed the procedure including the risks, benefits and alternatives for the proposed anesthesia with the patient or authorized representative who has indicated his/her understanding and acceptance.     Dental Advisory Given  Plan Discussed with: CRNA  Anesthesia Plan Comments:          Anesthesia Quick Evaluation

## 2023-10-28 ENCOUNTER — Ambulatory Visit (HOSPITAL_BASED_OUTPATIENT_CLINIC_OR_DEPARTMENT_OTHER): Payer: Self-pay | Admitting: Anesthesiology

## 2023-10-28 ENCOUNTER — Other Ambulatory Visit: Payer: Self-pay

## 2023-10-28 ENCOUNTER — Encounter (HOSPITAL_COMMUNITY): Payer: Self-pay | Admitting: Anesthesiology

## 2023-10-28 ENCOUNTER — Ambulatory Visit (HOSPITAL_COMMUNITY)
Admission: RE | Admit: 2023-10-28 | Discharge: 2023-10-28 | Disposition: A | Discharge status: Home / Self Care | Payer: Self-pay | Attending: Gastroenterology | Admitting: Gastroenterology

## 2023-10-28 ENCOUNTER — Encounter (HOSPITAL_COMMUNITY): Payer: Self-pay | Admitting: Gastroenterology

## 2023-10-28 ENCOUNTER — Encounter (HOSPITAL_COMMUNITY)
Admission: RE | Disposition: A | Discharge status: Home / Self Care | Payer: Self-pay | Source: Home / Self Care | Attending: Gastroenterology

## 2023-10-28 DIAGNOSIS — D124 Benign neoplasm of descending colon: Secondary | ICD-10-CM | POA: Insufficient documentation

## 2023-10-28 DIAGNOSIS — K648 Other hemorrhoids: Secondary | ICD-10-CM

## 2023-10-28 DIAGNOSIS — D128 Benign neoplasm of rectum: Secondary | ICD-10-CM | POA: Insufficient documentation

## 2023-10-28 DIAGNOSIS — D120 Benign neoplasm of cecum: Secondary | ICD-10-CM

## 2023-10-28 DIAGNOSIS — K219 Gastro-esophageal reflux disease without esophagitis: Secondary | ICD-10-CM | POA: Insufficient documentation

## 2023-10-28 DIAGNOSIS — F17210 Nicotine dependence, cigarettes, uncomplicated: Secondary | ICD-10-CM | POA: Insufficient documentation

## 2023-10-28 DIAGNOSIS — D123 Benign neoplasm of transverse colon: Secondary | ICD-10-CM | POA: Insufficient documentation

## 2023-10-28 DIAGNOSIS — K625 Hemorrhage of anus and rectum: Secondary | ICD-10-CM | POA: Insufficient documentation

## 2023-10-28 DIAGNOSIS — R197 Diarrhea, unspecified: Secondary | ICD-10-CM

## 2023-10-28 DIAGNOSIS — Z79899 Other long term (current) drug therapy: Secondary | ICD-10-CM | POA: Insufficient documentation

## 2023-10-28 HISTORY — PX: COLONOSCOPY: SHX5424

## 2023-10-28 LAB — HM COLONOSCOPY

## 2023-10-28 SURGERY — COLONOSCOPY
Anesthesia: General

## 2023-10-28 MED ORDER — LACTATED RINGERS IV SOLN: INTRAVENOUS | Status: DC

## 2023-10-28 MED ORDER — PROPOFOL 10 MG/ML IV BOLUS
INTRAVENOUS | Status: DC | PRN
  Administered 2023-10-28: 20 mg via INTRAVENOUS
  Administered 2023-10-28: 30 mg via INTRAVENOUS
  Administered 2023-10-28: 50 mg via INTRAVENOUS
  Administered 2023-10-28: 120 mg via INTRAVENOUS
  Administered 2023-10-28 (×2): 20 mg via INTRAVENOUS

## 2023-10-28 MED ORDER — LIDOCAINE HCL (CARDIAC) PF 100 MG/5ML IV SOSY
PREFILLED_SYRINGE | INTRAVENOUS | Status: DC | PRN
  Administered 2023-10-28: 100 mg via INTRAVENOUS

## 2023-10-28 MED ORDER — PHENYLEPHRINE HCL (PRESSORS) 10 MG/ML IV SOLN
INTRAVENOUS | Status: DC | PRN
  Administered 2023-10-28: 100 ug via INTRAVENOUS

## 2023-10-28 NOTE — Anesthesia Postprocedure Evaluation (Signed)
 Anesthesia Post Note  Patient: [name]  Procedure(s) Performed: COLONOSCOPY  Patient location during evaluation: Endoscopy Anesthesia Type: General Level of consciousness: awake and alert Pain management: pain level controlled Vital Signs Assessment: post-procedure vital signs reviewed and stable Respiratory status: spontaneous breathing, nonlabored ventilation and respiratory function stable Cardiovascular status: stable Anesthetic complications: no   There were no known notable events for this encounter.   Last Vitals:  Vitals:   10/28/23 0846 10/28/23 0852  BP: 92/70 102/76  Pulse:    Resp:    SpO2:      Last Pain:  Vitals:   10/28/23 0836  TempSrc: Oral  PainSc: 0-No pain                 Edgerrin Correia L Gabbie Marzo

## 2023-10-28 NOTE — Interval H&P Note (Signed)
 History and Physical Interval Note:  10/28/2023 7:32 AM  [name]  has presented today for surgery, with the diagnosis of rectal bleeding,diarrhea.  The various methods of treatment have been discussed with the patient and family. After consideration of risks, benefits and other options for treatment, the patient has consented to  Procedure(s) with comments: COLONOSCOPY (N/A) - 8:15 am, asa 1-2 as a surgical intervention.  The patient's history has been reviewed, patient examined, no change in status, stable for surgery.  I have reviewed the patient's chart and labs.  Questions were answered to the patient's satisfaction.     Tiny Rietz Castaneda Mayorga

## 2023-10-28 NOTE — Op Note (Signed)
 [facility] Patient Name: [name] Procedure Date: 10/28/2023 7:57 AM MRN: [id number] Date of Birth: 1980/03/05 Attending MD: Toribio Fortune , , [id number] CSN: [id number] Age: 43 Admit Type: Outpatient Procedure:                Colonoscopy Indications:              Rectal bleeding Providers:                Toribio Fortune, Rosina Sprague, Devere Lodge Referring MD:              Medicines:                Monitored Anesthesia Care Complications:            No immediate complications. Estimated Blood Loss:     Estimated blood loss: none. Procedure:                Pre-Anesthesia Assessment:                           - Prior to the procedure, a History and Physical                            was performed, and patient medications, allergies                            and sensitivities were reviewed. The patient's                            tolerance of previous anesthesia was reviewed.                           - The risks and benefits of the procedure and the                            sedation options and risks were discussed with the                            patient. All questions were answered and informed                            consent was obtained.                           - ASA Grade Assessment: II - A patient with mild                            systemic disease.                           After obtaining informed consent, the colonoscope                            was passed under direct vision. Throughout the                            procedure, the patient's blood pressure, pulse, and  oxygen saturations were monitored continuously. The                            [id number]-[id number] ([id number]) Peds Colon was introduced                            through the anus and advanced to the the terminal                            ileum. The colonoscopy was performed without                            difficulty. The patient tolerated the procedure                             well. The quality of the bowel preparation was                            excellent. Scope In: 8:08:25 AM Scope Out: 8:33:01 AM Scope Withdrawal Time: 0 hours 22 minutes 19 seconds  Total Procedure Duration: 0 hours 24 minutes 36 seconds  Findings:      The perianal and digital rectal examinations were normal.      The terminal ileum appeared normal.      Three sessile polyps were found in the transverse colon and cecum. The       polyps were 3 to 6 mm in size. These polyps were removed with a cold       snare. Resection and retrieval were complete.      Two sessile polyps were found in the descending colon. The polyps were 3       to 5 mm in size. These polyps were removed with a cold snare. Resection       and retrieval were complete.      An 8 mm polyp was found in the rectum. The polyp was sessile. Area was       successfully injected with 1 mL Eleview for a lift polypectomy. Imaging       was performed using white light and narrow band imaging to visualize the       mucosa and demarcate the polyp site after injection for EMR purposes.       The polyp was removed with a hot snare. Resection and retrieval were       complete.      Non-bleeding internal hemorrhoids were found during retroflexion. The       hemorrhoids were medium-sized. Impression:               via EMR.                           - The examined portion of the ileum was normal.                           - Three 3 to 6 mm polyps in the transverse colon                            and in the cecum, removed with a cold snare.  Resected and retrieved.                           - Two 3 to 5 mm polyps in the descending colon,                            removed with a cold snare. Resected and retrieved.                           - One 8 mm polyp in the rectum, removed with a hot                            snare. Resected and retrieved via EMR.                           -  Non-bleeding internal hemorrhoids. Moderate Sedation:      Per Anesthesia Care Recommendation:           - Discharge patient to home (ambulatory).                           - Resume previous diet.                           - Await pathology results.                           - Repeat colonoscopy for surveillance based on                            pathology results.                           - Start taking one capful of Miralax every day,                            uptitrate up to 3 capfuls per day as needed to keep                            stools loose.                           - If persisting or worsening rectal bleeding, will                            proceed with hemorrhoidal banding Procedure Code(s):        --- Professional ---                           [phone], 59, Colonoscopy, flexible; with removal of                            tumor(s), polyp(s), or other lesion(s) by snare                            technique  54618, Colonoscopy, flexible; with directed                            submucosal injection(s), any substance Diagnosis Code(s):        --- Professional ---                           D12.3, Benign neoplasm of transverse colon (hepatic                            flexure or splenic flexure)                           D12.0, Benign neoplasm of cecum                           D12.4, Benign neoplasm of descending colon                           D12.8, Benign neoplasm of rectum                           K64.8, Other hemorrhoids                           K62.5, Hemorrhage of anus and rectum CPT copyright 2022 American Medical Association. All rights reserved. The codes documented in this report are preliminary and upon coder review may  be revised to meet current compliance requirements. Toribio Fortune, MD Toribio Fortune,  10/28/2023 8:40:57 AM This report has been signed electronically. Number of Addenda: 0

## 2023-10-28 NOTE — Transfer of Care (Signed)
 Immediate Anesthesia Transfer of Care Note  Patient: [name]  Procedure(s) Performed: COLONOSCOPY  Patient Location: Endoscopy Unit  Anesthesia Type:General  Level of Consciousness: awake, alert , oriented, and patient cooperative  Airway & Oxygen Therapy: Patient Spontanous Breathing  Post-op Assessment: Report given to RN and Post -op Vital signs reviewed and stable  Post vital signs: Reviewed and stable  Last Vitals:  Vitals Value Taken Time  BP 87/59 10/28/23 08:36  Temp    Pulse 70 10/28/23 08:36  Resp 18 10/28/23 08:36  SpO2 99 % 10/28/23 08:36    Last Pain:  Vitals:   10/28/23 0836  TempSrc: Oral  PainSc:       Patients Stated Pain Goal: 4 (10/28/23 0714)  Complications: No notable events documented.

## 2023-10-28 NOTE — Discharge Instructions (Signed)
 You are being discharged to home.  Resume your previous diet.  We are waiting for your pathology results.  Your physician has recommended a repeat colonoscopy for surveillance based on pathology results.  Start taking one capful of Miralax every day, uptitrate up to 3 capfuls per day as needed to keep stools loose.

## 2023-10-31 ENCOUNTER — Encounter (INDEPENDENT_AMBULATORY_CARE_PROVIDER_SITE_OTHER): Payer: Self-pay | Admitting: *Deleted

## 2023-11-01 ENCOUNTER — Ambulatory Visit: Payer: Self-pay | Admitting: Gastroenterology

## 2023-11-01 ENCOUNTER — Encounter (HOSPITAL_COMMUNITY): Payer: Self-pay | Admitting: Gastroenterology

## 2023-11-01 LAB — SURGICAL PATHOLOGY

## 2023-11-02 ENCOUNTER — Encounter (INDEPENDENT_AMBULATORY_CARE_PROVIDER_SITE_OTHER): Payer: Self-pay | Admitting: *Deleted

## 2023-11-02 NOTE — Progress Notes (Signed)
 3 yr TCS noted in recall Patient result letter mailed procedure note and pathology result faxed to PCP

## 2023-12-13 ENCOUNTER — Encounter (INDEPENDENT_AMBULATORY_CARE_PROVIDER_SITE_OTHER): Payer: Self-pay | Admitting: Gastroenterology

## 2023-12-29 ENCOUNTER — Encounter (INDEPENDENT_AMBULATORY_CARE_PROVIDER_SITE_OTHER): Payer: Self-pay | Admitting: Gastroenterology
